# Patient Record
Sex: Female | Born: 1940
Health system: Southern US, Community
[De-identification: ages and names within clinical notes are randomized; demographics above are authoritative.]

## PROBLEM LIST (undated history)

## (undated) DIAGNOSIS — R911 Solitary pulmonary nodule: Secondary | ICD-10-CM

## (undated) DIAGNOSIS — I447 Left bundle-branch block, unspecified: Secondary | ICD-10-CM

## (undated) DIAGNOSIS — I428 Other cardiomyopathies: Secondary | ICD-10-CM

## (undated) HISTORY — DX: Other cardiomyopathies: I42.8

## (undated) HISTORY — DX: Left bundle-branch block, unspecified: I44.7

## (undated) HISTORY — DX: Solitary pulmonary nodule: R91.1

## (undated) HISTORY — PX: BREAST SURGERY: SHX581

## (undated) HISTORY — PX: BLADDER SURGERY: SHX569

## (undated) HISTORY — PX: ABDOMINAL HYSTERECTOMY: SHX81

## (undated) SURGERY — LEFT AND RIGHT HEART CATHETERIZATION WITH CORONARY/GRAFT ANGIOGRAM
Anesthesia: Moderate Sedation | Laterality: Left

---

## 1998-07-29 ENCOUNTER — Other Ambulatory Visit: Admission: RE | Admit: 1998-07-29 | Discharge: 1998-07-29 | Payer: Self-pay | Admitting: Obstetrics & Gynecology

## 1999-09-14 ENCOUNTER — Other Ambulatory Visit: Admission: RE | Admit: 1999-09-14 | Discharge: 1999-09-14 | Payer: Self-pay | Admitting: Obstetrics and Gynecology

## 2000-11-29 ENCOUNTER — Other Ambulatory Visit: Admission: RE | Admit: 2000-11-29 | Discharge: 2000-11-29 | Payer: Self-pay | Admitting: Obstetrics and Gynecology

## 2002-01-09 ENCOUNTER — Other Ambulatory Visit: Admission: RE | Admit: 2002-01-09 | Discharge: 2002-01-09 | Payer: Self-pay | Admitting: Obstetrics and Gynecology

## 2005-07-01 ENCOUNTER — Emergency Department (HOSPITAL_COMMUNITY): Admission: EM | Admit: 2005-07-01 | Discharge: 2005-07-01 | Payer: Self-pay | Admitting: Emergency Medicine

## 2008-01-16 ENCOUNTER — Ambulatory Visit (HOSPITAL_COMMUNITY): Admission: RE | Admit: 2008-01-16 | Discharge: 2008-01-17 | Payer: Self-pay | Admitting: Obstetrics and Gynecology

## 2010-12-13 NOTE — Op Note (Signed)
Dominique Hernandez, Dominique Hernandez            ACCOUNT NO.:  [id number]   MEDICAL RECORD NO.:  [id number]          PATIENT TYPE:  OIB   LOCATION:  9311                          FACILITY:  [facility]   PHYSICIAN:  Huel Cote, M.D. DATE OF BIRTH:  [birth year]   DATE OF PROCEDURE:  DATE OF DISCHARGE:                               OPERATIVE REPORT   PREOPERATIVE DIAGNOSES:  1. Cystocele, symptomatic.  2. Rectocele.   POSTOPERATIVE DIAGNOSES:  1. Cystocele, symptomatic.  2. Rectocele with mild cuff prolapse also noted.   PROCEDURE:  Anterior-posterior repair.   SURGEON:  Huel Cote, M.D.   ASSISTANT:  Sherron Monday, MD   ANESTHESIA:  General.   FINDINGS:  There was a moderate cystocele and rectocele noted and mild  cuff prolapse as well.   ESTIMATED BLOOD LOSS:  100 mL.   URINE OUTPUT:  350 mL clear urine.   IV FLUIDS:  1500 mL LR.   COMPLICATIONS:  None.   OPERATIVE PROCEDURE:  The patient was taken to the operating room where  general anesthesia was obtained without difficulty.  She was then  prepped and draped in normal sterile fashion in the dorsal lithotomy  position.  Speculum was placed within the vagina, which was inspected  and a very small cystocele noted.  However, when the vaginal cuff was  grasped with very minimal traction, this protruded quite impressively  down to the level of the introitus.  Partial part of this was larger  cystocele and part was some mild cuff prolapse.  The vaginal cuff was  then grasped with 2 Allis clamps and the vaginal mucosa anteriorly was  injected with 5.5% Marcaine along the midline.  A small amount of mucosa  was trimmed away at the vaginal cuff and this was underscored along the  midline down to approximately 2 cm below the urethra.  With this mucosa,  then dissected off the underlying pubovesical fascia.  The mucosa flaps  were retracted laterally and the cystocele reduced medially.  The  pubovesical fascia was then reapproximated  with several interrupted  sutures of 0 Vicryl and the cystocele effectively reduced.  The excess  mucosa was then trimmed away and closed with 2-0 Vicryl in a running  locked fashion.  Good hemostasis was then noted and the cystocele  appeared to have been reduced in entirety.  There was still some slight  vaginal cuff prolapse; however, it was not as nearly apparent as prior  to the repair.  Attention was then turned to the floor of the vagina  where at the introitus several hymenal remnants were noted to be  protruding.  The introital ring was then grasped with Allis clamps and  these hymenal tags were removed with the scissors.  The mucosa was then  trimmed with Mayo scissors and the vaginal mucosa injected with the 0.5%  solution of Marcaine.  This was carried up to the level of the vaginal  cuff.  Vaginal mucosa was then similar to the anterior mucosa  underscored with Metzenbaum scissors and dissected off the underlying  puborectal fascia.  This was then dissected off the mucosal flaps and  these were retracted laterally.  There was a small rectocele noted,  which was easily reducible and the rectocele was reduced with the 0  Vicryl utilized to reapproximate the rectovaginal fascia in several  interrupted sutures.  The excess mucosa was trimmed away and the mucosa  then closed with 2-0 Vicryl in a running fashion.  The vagina was  inspected and easily admitted 2 fingers and all appeared fairly well  supported, and cystocele and rectocele were no longer apparent.  At this  juncture, all incisions were inspected and found to be hemostatic.  Therefore, the vagina was packed with esterase-coated gauze and the  Foley catheter left in place.  The sponge lap and needle counts were  then noted to be correct x2 and the patient was awakened and taken to  the recovery room in stable condition.      Huel Cote, M.D.  Electronically Signed     KR/MEDQ  D:  01/16/2008  T:   01/17/2008  Job:  [id number]

## 2010-12-13 NOTE — H&P (Signed)
Dominique Hernandez, BARTHEL            ACCOUNT NO.:  [id number]   MEDICAL RECORD NO.:  [id number]          PATIENT TYPE:  AMB   LOCATION:  SDC                           FACILITY:  [facility]   PHYSICIAN:  Huel Cote, M.D. DATE OF BIRTH:  12-15-1940   DATE OF ADMISSION:  01/16/2008  DATE OF DISCHARGE:                              HISTORY & PHYSICAL   The patient is a 70 year old female, G2 P2, who is coming in for an  increasingly symptomatic cystocele and a mild to moderate rectocele,  wishing to proceed with an anterior-posterior repair.  She currently has  no significant bladder symptoms.  Her cystocele is most evident in the  standing, squatting position and has become increasingly uncomfortable;  therefore, she is desirous of proceeding with correction.   PAST MEDICAL HISTORY:  Significant only for osteoporosis.   PAST SURGICAL HISTORY:  Total abdominal hysterectomy in 1978 and breast  implants.   PAST OBSTETRICAL HISTORY:  Vaginal delivery x2.   PAST GYN HISTORY:  No abnormal Pap smears.   MEDICATIONS:  Fosamax.   ALLERGIES:  SEPTRA.   There is no family history of breast, colon cancer or other GYN  malignancies.   PHYSICAL EXAM:  Her blood pressure is 148/80.  Weight is 114.  BREASTS:  No masses, discharge or adenopathy noted with normal implants  bilaterally.  CARDIAC:  Regular rate and rhythm.  LUNGS:  Clear.  ABDOMEN:  Soft, nontender.  She has normal external genitalia with some atrophic change.  Cuff has  no lesions.  There is a cystocele present, which expands more with  Valsalva and squat to a moderate degree.  There is a mild to moderate  rectocele as well.  The patient does not have any urinary symptoms or  stress urinary incontinence, mostly just the sensation of pressure and  discomfort when her bladder is bulging through the introitus after she  has been on her feet at times.   She was counseled as to the risks and benefits of surgery including  bleeding,  infection and possible of damage to bowel or bladder.  She  understands these risks and desires to proceed with surgery as stated.  We went over the procedure in detail and as far as postop expectations  and other issues surrounding recovery, she is well aware of all of her  options and desires to proceed as stated.      Huel Cote, M.D.  Electronically Signed     KR/MEDQ  D:  01/15/2008  T:  01/15/2008  Job:  [id number]

## 2011-04-27 LAB — CBC
HCT: 35.9 — ABNORMAL LOW
HCT: 41.6
Hemoglobin: 12
Platelets: 269
RDW: 12.2
WBC: 6.7
WBC: 8.7

## 2012-10-06 ENCOUNTER — Emergency Department (HOSPITAL_COMMUNITY): Payer: Medicare Other

## 2012-10-06 ENCOUNTER — Inpatient Hospital Stay (HOSPITAL_COMMUNITY)
Admission: EM | Admit: 2012-10-06 | Discharge: 2012-10-10 | DRG: 287 | Disposition: A | Discharge status: Home Health | Payer: Medicare Other | Attending: Internal Medicine | Admitting: Internal Medicine

## 2012-10-06 ENCOUNTER — Other Ambulatory Visit: Payer: Self-pay

## 2012-10-06 ENCOUNTER — Encounter (HOSPITAL_COMMUNITY): Payer: Self-pay | Admitting: *Deleted

## 2012-10-06 DIAGNOSIS — I428 Other cardiomyopathies: Secondary | ICD-10-CM | POA: Diagnosis present

## 2012-10-06 DIAGNOSIS — Z87891 Personal history of nicotine dependence: Secondary | ICD-10-CM

## 2012-10-06 DIAGNOSIS — Z8249 Family history of ischemic heart disease and other diseases of the circulatory system: Secondary | ICD-10-CM

## 2012-10-06 DIAGNOSIS — I447 Left bundle-branch block, unspecified: Secondary | ICD-10-CM | POA: Diagnosis present

## 2012-10-06 DIAGNOSIS — R911 Solitary pulmonary nodule: Secondary | ICD-10-CM | POA: Diagnosis present

## 2012-10-06 DIAGNOSIS — I509 Heart failure, unspecified: Secondary | ICD-10-CM | POA: Diagnosis present

## 2012-10-06 DIAGNOSIS — D72829 Elevated white blood cell count, unspecified: Secondary | ICD-10-CM | POA: Diagnosis present

## 2012-10-06 DIAGNOSIS — I5021 Acute systolic (congestive) heart failure: Principal | ICD-10-CM | POA: Diagnosis present

## 2012-10-06 LAB — CBC WITH DIFFERENTIAL/PLATELET
Basophils Relative: 0 % (ref 0–1)
Eosinophils Absolute: 0 10*3/uL (ref 0.0–0.7)
Eosinophils Relative: 0 % (ref 0–5)
HCT: 42.2 % (ref 36.0–46.0)
Hemoglobin: 14.3 g/dL (ref 12.0–15.0)
MCH: 30 pg (ref 26.0–34.0)
MCHC: 33.9 g/dL (ref 30.0–36.0)
Monocytes Absolute: 1.2 10*3/uL — ABNORMAL HIGH (ref 0.1–1.0)
Monocytes Relative: 7 % (ref 3–12)

## 2012-10-06 LAB — TROPONIN I
Troponin I: <0.3 ng/mL (ref <0.30)
Troponin I: 0.33 ng/mL (ref <0.30)

## 2012-10-06 LAB — POCT I-STAT, CHEM 8
BUN: 18 mg/dL (ref 6–23)
Calcium, Ion: 1.09 mmol/L — ABNORMAL LOW (ref 1.13–1.30)
Creatinine, Ser: 0.9 mg/dL (ref 0.50–1.10)
TCO2: 24 mmol/L (ref 0–100)

## 2012-10-06 LAB — CREATININE, SERUM
GFR calc Af Amer: 81 mL/min — ABNORMAL LOW (ref >90)
GFR calc non Af Amer: 70 mL/min — ABNORMAL LOW (ref >90)

## 2012-10-06 LAB — POCT I-STAT TROPONIN I

## 2012-10-06 LAB — CBC
HCT: 43 % (ref 36.0–46.0)
Hemoglobin: 15.1 g/dL — ABNORMAL HIGH (ref 12.0–15.0)
MCV: 87.4 fL (ref 78.0–100.0)
RBC: 4.92 MIL/uL (ref 3.87–5.11)
WBC: 10.5 10*3/uL (ref 4.0–10.5)

## 2012-10-06 MED ORDER — FUROSEMIDE 10 MG/ML IJ SOLN
40.0000 mg | Freq: Every day | INTRAMUSCULAR | Status: DC
  Administered 2012-10-06 – 2012-10-07 (×2): 40 mg via INTRAVENOUS
  Filled 2012-10-06 (×3): qty 4

## 2012-10-06 MED ORDER — ONDANSETRON HCL 4 MG/2ML IJ SOLN
4.0000 mg | Freq: Four times a day (QID) | INTRAMUSCULAR | Status: DC | PRN
  Administered 2012-10-07: 4 mg via INTRAVENOUS
  Filled 2012-10-06: qty 2

## 2012-10-06 MED ORDER — FUROSEMIDE 8 MG/ML PO SOLN: 40.0000 mg | Freq: Once | ORAL | Status: DC

## 2012-10-06 MED ORDER — ISOSORBIDE MONONITRATE 15 MG HALF TABLET
15.0000 mg | ORAL_TABLET | Freq: Every day | ORAL | Status: DC
  Administered 2012-10-06 – 2012-10-10 (×4): 15 mg via ORAL
  Filled 2012-10-06 (×5): qty 1

## 2012-10-06 MED ORDER — POTASSIUM CHLORIDE CRYS ER 10 MEQ PO TBCR
10.0000 meq | EXTENDED_RELEASE_TABLET | Freq: Every day | ORAL | Status: DC
  Administered 2012-10-06 – 2012-10-10 (×5): 10 meq via ORAL
  Filled 2012-10-06 (×5): qty 1

## 2012-10-06 MED ORDER — SODIUM CHLORIDE 0.9 % IJ SOLN: 3.0000 mL | INTRAMUSCULAR | Status: DC | PRN

## 2012-10-06 MED ORDER — SODIUM CHLORIDE 0.9 % IJ SOLN
3.0000 mL | Freq: Two times a day (BID) | INTRAMUSCULAR | Status: DC
  Administered 2012-10-08 – 2012-10-09 (×2): 3 mL via INTRAVENOUS

## 2012-10-06 MED ORDER — HEPARIN SODIUM (PORCINE) 5000 UNIT/ML IJ SOLN
5000.0000 [IU] | Freq: Three times a day (TID) | INTRAMUSCULAR | Status: DC
  Filled 2012-10-06 (×2): qty 1

## 2012-10-06 MED ORDER — HEPARIN BOLUS VIA INFUSION
3000.0000 [IU] | Freq: Once | INTRAVENOUS | Status: AC
  Administered 2012-10-06: 3000 [IU] via INTRAVENOUS
  Filled 2012-10-06: qty 3000

## 2012-10-06 MED ORDER — ACETAMINOPHEN 325 MG PO TABS: 650.0000 mg | ORAL_TABLET | ORAL | Status: DC | PRN

## 2012-10-06 MED ORDER — LISINOPRIL 2.5 MG PO TABS
2.5000 mg | ORAL_TABLET | Freq: Every day | ORAL | Status: DC
  Administered 2012-10-06 – 2012-10-09 (×3): 2.5 mg via ORAL
  Filled 2012-10-06 (×4): qty 1

## 2012-10-06 MED ORDER — ONDANSETRON HCL 4 MG/2ML IJ SOLN: 4.0000 mg | Freq: Three times a day (TID) | INTRAMUSCULAR | Status: AC | PRN

## 2012-10-06 MED ORDER — IOHEXOL 350 MG/ML SOLN
80.0000 mL | Freq: Once | INTRAVENOUS | Status: AC | PRN
  Administered 2012-10-06: 80 mL via INTRAVENOUS

## 2012-10-06 MED ORDER — HEPARIN (PORCINE) IN NACL 100-0.45 UNIT/ML-% IJ SOLN
850.0000 [IU]/h | INTRAMUSCULAR | Status: DC
  Administered 2012-10-06: 650 [IU]/h via INTRAVENOUS
  Administered 2012-10-07: 850 [IU]/h via INTRAVENOUS
  Filled 2012-10-06 (×5): qty 250

## 2012-10-06 MED ORDER — SODIUM CHLORIDE 0.9 % IV SOLN: 250.0000 mL | INTRAVENOUS | Status: DC | PRN

## 2012-10-06 MED ORDER — ALBUTEROL SULFATE (5 MG/ML) 0.5% IN NEBU: 2.5000 mg | INHALATION_SOLUTION | RESPIRATORY_TRACT | Status: AC | PRN

## 2012-10-06 MED ORDER — FUROSEMIDE 10 MG/ML IJ SOLN
40.0000 mg | Freq: Once | INTRAMUSCULAR | Status: AC
  Administered 2012-10-06: 40 mg via INTRAVENOUS
  Filled 2012-10-06: qty 4

## 2012-10-06 MED ORDER — FUROSEMIDE 10 MG/ML IJ SOLN
20.0000 mg | Freq: Every day | INTRAMUSCULAR | Status: DC
  Filled 2012-10-06: qty 2

## 2012-10-06 MED ORDER — CARVEDILOL 3.125 MG PO TABS
3.1250 mg | ORAL_TABLET | Freq: Two times a day (BID) | ORAL | Status: DC
  Administered 2012-10-06 – 2012-10-09 (×5): 3.125 mg via ORAL
  Filled 2012-10-06 (×8): qty 1

## 2012-10-06 MED ORDER — ASPIRIN EC 81 MG PO TBEC
81.0000 mg | DELAYED_RELEASE_TABLET | Freq: Every day | ORAL | Status: DC
  Administered 2012-10-06 – 2012-10-10 (×4): 81 mg via ORAL
  Filled 2012-10-06 (×5): qty 1

## 2012-10-06 NOTE — ED Notes (Signed)
Pt states that she was sleeping lying on the couch and she got up to go to bed,and became SOB. Pt states that since arrival to E.D. Dominique Hernandez is better. Pt denies cough, denies pain.

## 2012-10-06 NOTE — H&P (Addendum)
[facility] History and Physical  Dominique Hernandez WUJ:[id number] DOB: November 19, 1940 DOA: 10/06/2012  Referring physician: Dr. Rulon Abide PCP: Ailene Ravel, MD  Specialists: [facility] heart and vascular. Dr. Allyson Sabal  Chief Complaint: Shortness of breath  HPI: Dominique Hernandez is a 72 y.o. female  Has been generally healthy. Present to the emergency department complaining of shortness of breath. The shortness of breath has been present for 2 weeks. Activity reportedly makes the shortness of breath worse. This is also been accompanied by cough which has been nonproductive. Nothing that the patient is aware of makes it better. The patient also endorses 2 pillow orthopnea. She denies any fever or sick contacts. The problem since onset has been persistent and worsening as a result patient decided to come to the emergency department for further evaluation.  While in the ED the patient was found to have a BNP elevated at 4900. CT angiogram was performed which was interpreted as no evidence of pulmonary embolism with mild congestive heart failure. There was also a 6 mm lung nodule and subsequent 6 month CT evaluation was recommended by the radiologist. We were consult for admission evaluation given the onset CHF.   Review of Systems: 10 point review of system reviewed with patient and negative unless otherwise mentioned above History reviewed. No pertinent past medical history. Past Surgical History  Procedure Laterality Date  . Abdominal hysterectomy     Social History:  reports that she has never smoked. She does not have any smokeless tobacco history on file. She reports that she does not drink alcohol or use illicit drugs. where does patient live--home, ALF, SNF? and with whom if at home? Lives at home Can patient participate in ADLs? Yes  No Known Allergies  History reviewed. No pertinent family history.  Family history of heart disease specifically congestive heart failure  Prior to  Admission medications   Medication Sig Start Date End Date Taking? Authorizing Aicia Babinski  Calcium Carbonate-Vitamin D (CALCIUM + D PO) Take 1 tablet by mouth daily.   Yes Historical Mae Denunzio, MD  Omega-3 Fatty Acids (FISH OIL PO) Take 1 capsule by mouth daily.   Yes Historical Rebbecca Osuna, MD   Physical Exam: Filed Vitals:   10/06/12 0600 10/06/12 0615 10/06/12 0630 10/06/12 0714  BP: 135/80 125/63 123/72 117/71  Pulse: 121 107 100 99  Temp:      TempSrc:      Resp: 21 18 22 20   SpO2: 93% 93% 92%      General:  Patient in no acute distress, alert and awake  Eyes: Extraocular movements intact, nonicteric  ENT: Normal exterior appearance, no masses on visual exam  Neck: Supple, no goiter  Cardiovascular: Regular rate and rhythm, no murmurs   Respiratory: rhales at bases bilaterally, no wheezes, breath sounds bilaterally  Abdomen: soft nontender nondistended  Skin: Warm and dry  Musculoskeletal: No cyanosis no clubbing  Psychiatric: Mood and affect appropriate  Neurologic: Answers questions appropriately, moves all extremities equally  Labs on Admission:  Basic Metabolic Panel:  Recent Labs Lab 10/06/12 0558  NA 140  K 3.5  CL 105  GLUCOSE 222*  BUN 18  CREATININE 0.90   Liver Function Tests: No results found for this basename: AST, ALT, ALKPHOS, BILITOT, PROT, ALBUMIN,  in the last 168 hours No results found for this basename: LIPASE, AMYLASE,  in the last 168 hours No results found for this basename: AMMONIA,  in the last 168 hours CBC:  Recent Labs Lab 10/06/12 0545 10/06/12 0558  WBC 16.0*  --   NEUTROABS 13.1*  --   HGB 14.3 14.6  HCT 42.2 43.0  MCV 88.7  --   PLT 258  --    Cardiac Enzymes: No results found for this basename: CKTOTAL, CKMB, CKMBINDEX, TROPONINI,  in the last 168 hours  BNP (last 3 results)  Recent Labs  10/06/12 0545  PROBNP 4952.0*   CBG: No results found for this basename: GLUCAP,  in the last 168 hours  Radiological  Exams on Admission: Ct Angio Chest Pe W/cm &/or Wo Cm  10/06/2012  *RADIOLOGY REPORT*  Clinical Data: Shortness of breath and tachycardia.  Hypoxia. History of hysterectomy.  CT ANGIOGRAPHY CHEST  Technique:  Multidetector CT imaging of the chest using the standard protocol during bolus administration of intravenous contrast. Multiplanar reconstructed images including MIPs were obtained and reviewed to evaluate the vascular anatomy.  Contrast: 80mL OMNIPAQUE IOHEXOL 350 MG/ML SOLN  Comparison: Plain films earlier today  Findings: Lung windows demonstrate moderate centrilobular emphysema.  Patchy bibasilar atelectasis. Septal thickening at the lung bases, most consistent with pulmonary venous congestion.  Minimal right lower lobe subpleural nodularity, measuring up to 3 mm on image 49/series 5 A 6 mm right lower lobe nodule on image 67/series 5  Soft tissue windows:  The quality of this exam for evaluation of pulmonary embolism is good. No evidence of pulmonary embolism.  Bilateral breast implants.  Normal aortic caliber.  Mild cardiomegaly with small bilateral pleural effusions.  No pericardial effusion.  Subcarinal node measures 1.5 cm on image 42/series 4.  Prominent right infrahilar nodal tissue on image 49/series 4.  Limited abdominal imaging demonstrates old granulomatous disease in the liver.  Tiny left liver lobe cyst.  No acute osseous abnormality.  IMPRESSION:  1. No evidence of pulmonary embolism. 2.  Findings of mild congestive heart failure. 3.  Lung nodules up to 6 mm. Given the concurrent centrilobular emphysema, follow-up chest CT at 6  months is recommended.  This recommendation follows the consensus statement: "Guidelines for Management of Small Pulmonary Nodules Detected on CT Scans: A Statement from the Fleischner Society" as published in Radiology 2005; 237:395-400. Online at: DietDisorder.cz.  4.  Prominent thoracic nodes.  Favored to be related to  congestive heart failure.  These can be reevaluated at follow-up.   Original Report Authenticated By: Jeronimo Greaves, M.D.    Dg Chest Port 1 View  10/06/2012  *RADIOLOGY REPORT*  Clinical Data: Shortness of breath.  PORTABLE CHEST - 1 VIEW  Comparison: Chest radiograph performed 07/01/2005  Findings: The lungs are well-aerated.  Small bilateral pleural effusions are likely present.  Bibasilar airspace opacification is noted, with underlying vascular congestion.  Findings raise concern for pulmonary edema, though multifocal pneumonia could have a similar appearance.  No pneumothorax is seen.  The cardiomediastinal silhouette is borderline enlarged.  No acute osseous abnormalities are seen.  IMPRESSION: Bibasilar airspace opacification, with underlying vascular congestion and borderline cardiomegaly.  Likely small bilateral pleural effusions.  Findings raise concern for pulmonary edema, though multifocal pneumonia could have a similar appearance.   Original Report Authenticated By: Tonia Ghent, M.D.     EKG: Independently reviewed. Sinus tachycardia with left bundle branch block no prior EKG for comparison  Assessment/Plan Principal Problem:   Acute exacerbation of CHF (congestive heart failure) Active Problems:   Lung nodule   Leukocytosis   1. Acute exacerbation of his congestive heart failure - Daily weights, monitor I.'s and O's - EKG next a.m. - Cardiology consulted  Given new-onset CHF - Troponins every 6 hours - Diuretics with Lasix 20mg  IV daily - Will add ACE inhibitor - Place on aspirin  - Addendum: Etiology uncertain and there for will evaluate with echocardiogram  2. Leukocytosis - Most likely a stress reaction to #1 - At this point no source of infection and therefore will not start antibiotics  3. lung nodule - As recommended by radiologist will recommend repeat CT scan of chest in 6 months  4. DVT prophylaxis - heparin   Code Status: full  Family Communication: spoke  to sister and daughter in room Disposition Plan:  Pending further work up and recommendations.  Time spent: > 60 minutes  Penny Pia [facility] [phone]  If 7PM-7AM, please contact night-coverage [email or web address] Password [email or web address] 10/06/2012, 11:01 AM

## 2012-10-06 NOTE — ED Provider Notes (Signed)
History     CSN: [id number]  Arrival date & time 10/06/12  0504   None     Chief Complaint  Patient presents with  . Shortness of Breath    (Consider location/radiation/quality/duration/timing/severity/associated sxs/prior treatment) HPI Dominique Hernandez is a 72 y.o. female chief complaint of shortness of breath. Patient has no known medical history at this time. She is only taking supplements. Patient says she was lying on the couch this evening, woke up to go to bed and she was short of breath, on arrival, patient SpO2 was 85% with a pulse rate of 129.  Her shortness of breath had been constant since earlier this evening, exertion made it worse, there is no associated chest pain, no headaches, no fevers, no chills, no abdominal pain, no nausea or vomiting. Patient has no history of venous thromboembolic disease, has not had any hemoptysis, denies any history of heart failure, swelling in the ankles, or occupational exposure which may affect her lungs such as asbestos, the textile industry.  Patient admits to being a prior smoker, she has an approximate 10-pack-year history smoking half a pack of cigarettes over a 20 year period.  Denies illicit drug.   History reviewed. No pertinent past medical history.  Past Surgical History  Procedure Laterality Date  . Abdominal hysterectomy      History reviewed. No pertinent family history.  History  Substance Use Topics  . Smoking status: Never Smoker   . Smokeless tobacco: Not on file  . Alcohol Use: No    OB History   Grav Para Term Preterm Abortions TAB SAB Ect Mult Living                  Review of Systems At least 10pt or greater review of systems completed and are negative except where specified in the HPI.  Allergies  Review of patient's allergies indicates no known allergies.  Home Medications  No current outpatient prescriptions on file.  BP 160/90  Pulse 129  Temp(Src) 97.9 F (36.6 C) (Oral)  Resp 20  SpO2  85%  Physical Exam  Nursing notes reviewed.  Electronic medical record reviewed. VITAL SIGNS:   Filed Vitals:   10/06/12 0600 10/06/12 0615 10/06/12 0630 10/06/12 0714  BP: 135/80 125/63 123/72 117/71  Pulse: 121 107 100 99  Temp:      TempSrc:      Resp: 21 18 22 20   SpO2: 93% 93% 92%    CONSTITUTIONAL: Awake, oriented x4, appears to be having mild difficulty breathing, dyspneic on my exam breathing about 28 times a minute HENT: Atraumatic, normocephalic, oral mucosa pink and moist, airway patent. Nares patent without drainage. External ears normal. EYES: Conjunctiva clear, EOMI, PERRLA NECK: Trachea midline, non-tender, supple CARDIOVASCULAR: Normal heart rate, Normal rhythm, No murmurs, rubs, gallops PULMONARY/CHEST: Decreased breath sounds bilaterally, rales at the bases to mid field, no wheezing, no rhonchi. Chest is nontender, bilateral breast implants. ABDOMINAL: Non-distended, soft, non-tender - no rebound or guarding.  BS normal. NEUROLOGIC: Non-focal, moving all four extremities, no gross sensory or motor deficits. EXTREMITIES: No clubbing, cyanosis, or trace  edema SKIN: Warm, Dry, No erythema, No rash   ED Course  Procedures (including critical care time)  Date: 10/06/2012  Rate: 119  Rhythm: Sinus tachycardia  QRS Axis: Left axis deviation  Intervals: Prolonged QRS  ST/T Wave abnormalities: normal  Conduction Disutrbances: Left bundle branch block  Narrative Interpretation: Likely left atrial enlargement. Left bundle branch block, sinus tachycardia, no prior EKG  for comparison. Does not meet Sgarbossa criteria: No ST elevation >= 1mm in a lead with concordant QRS complex No ST depression >= 1mm in ilead V1, V2 or V3 No ST elevation >=35mm in a lead with discordant QRS complex    Labs Reviewed  CBC WITH DIFFERENTIAL - Abnormal; Notable for the following:    WBC 16.0 (*)    Neutrophils Relative 82 (*)    Neutro Abs 13.1 (*)    Lymphocytes Relative 10 (*)     Monocytes Absolute 1.2 (*)    All other components within normal limits  D-DIMER, QUANTITATIVE - Abnormal; Notable for the following:    D-Dimer, Quant 1.27 (*)    All other components within normal limits  PRO B NATRIURETIC PEPTIDE - Abnormal; Notable for the following:    Pro B Natriuretic peptide (BNP) 4952.0 (*)    All other components within normal limits  POCT I-STAT, CHEM 8 - Abnormal; Notable for the following:    Glucose, Bld 222 (*)    Calcium, Ion 1.09 (*)    All other components within normal limits  POCT I-STAT TROPONIN I   Ct Angio Chest Pe W/cm &/or Wo Cm  10/06/2012  *RADIOLOGY REPORT*  Clinical Data: Shortness of breath and tachycardia.  Hypoxia. History of hysterectomy.  CT ANGIOGRAPHY CHEST  Technique:  Multidetector CT imaging of the chest using the standard protocol during bolus administration of intravenous contrast. Multiplanar reconstructed images including MIPs were obtained and reviewed to evaluate the vascular anatomy.  Contrast: 80mL OMNIPAQUE IOHEXOL 350 MG/ML SOLN  Comparison: Plain films earlier today  Findings: Lung windows demonstrate moderate centrilobular emphysema.  Patchy bibasilar atelectasis. Septal thickening at the lung bases, most consistent with pulmonary venous congestion.  Minimal right lower lobe subpleural nodularity, measuring up to 3 mm on image 49/series 5 A 6 mm right lower lobe nodule on image 67/series 5  Soft tissue windows:  The quality of this exam for evaluation of pulmonary embolism is good. No evidence of pulmonary embolism.  Bilateral breast implants.  Normal aortic caliber.  Mild cardiomegaly with small bilateral pleural effusions.  No pericardial effusion.  Subcarinal node measures 1.5 cm on image 42/series 4.  Prominent right infrahilar nodal tissue on image 49/series 4.  Limited abdominal imaging demonstrates old granulomatous disease in the liver.  Tiny left liver lobe cyst.  No acute osseous abnormality.  IMPRESSION:  1. No evidence of  pulmonary embolism. 2.  Findings of mild congestive heart failure. 3.  Lung nodules up to 6 mm. Given the concurrent centrilobular emphysema, follow-up chest CT at 6  months is recommended.  This recommendation follows the consensus statement: "Guidelines for Management of Small Pulmonary Nodules Detected on CT Scans: A Statement from the Fleischner Society" as published in Radiology 2005; 237:395-400. Online at: DietDisorder.cz.  4.  Prominent thoracic nodes.  Favored to be related to congestive heart failure.  These can be reevaluated at follow-up.   Original Report Authenticated By: Jeronimo Greaves, M.D.    Dg Chest Port 1 View  10/06/2012  *RADIOLOGY REPORT*  Clinical Data: Shortness of breath.  PORTABLE CHEST - 1 VIEW  Comparison: Chest radiograph performed 07/01/2005  Findings: The lungs are well-aerated.  Small bilateral pleural effusions are likely present.  Bibasilar airspace opacification is noted, with underlying vascular congestion.  Findings raise concern for pulmonary edema, though multifocal pneumonia could have a similar appearance.  No pneumothorax is seen.  The cardiomediastinal silhouette is borderline enlarged.  No acute osseous abnormalities are  seen.  IMPRESSION: Bibasilar airspace opacification, with underlying vascular congestion and borderline cardiomegaly.  Likely small bilateral pleural effusions.  Findings raise concern for pulmonary edema, though multifocal pneumonia could have a similar appearance.   Original Report Authenticated By: Tonia Ghent, M.D.      1. CHF, acute   2. Acute exacerbation of CHF (congestive heart failure)   3. Leukocytosis   4. Lung nodule       MDM  JHANAE JASKOWIAK is a 72 y.o. female with no pertinent medical history presents with acute onset dyspnea that started last night-physical exam is consistent with volume overload with rales in the bases and diminished breath sounds.  Acute onset concerning for PE  also.  LBBB on EKG - no prior EKG for comparison - no chest pain.  Doesn't meet Sgarbossa criteria.  I don't think this is ACS, likely had LBBB previously from prior microvascular/cardiomyopathy disease and CHF as sequelae.  Labs including BNP and Trop ordered.   BNP elevated, trop negative.  DDim also elevated - CT angiogram ordered - shows no acute PE.  Bilateral pulmonary effusions, likely COPD.  CHF most likely, responded well to lasix and breathing better.  Discussed admission for new onset acute HF with patient and with [facility].  Admit stable to Tele.          Jones Skene, MD 10/06/12 1419

## 2012-10-06 NOTE — Consult Note (Signed)
Reason for Consult: acute CHF  Referring Physician: Dr. Cena Benton [facility]  Cardiology: NEW- Dr. Allyson Sabal    PCP Dr. Sharman Crate Hamrick    Dominique Hernandez is an 72 y.o. female.    Chief Complaint: acute SOB   HPI: 72 y.o. female in good health presented to the emergency department early this am complaining of shortness of breath. The shortness of breath has been present for 2 weeks, but today it was severe.  Activity reportedly makes the shortness of breath worse. This is also been accompanied by cough which has been nonproductive. Nothing that the patient is aware of makes it better. The patient also endorses 2 pillow orthopnea. This AM she had to sit straight up and even bend forward slightly to breathe. She denied any fever or sick contacts, no recent viral illnesses. The problem since onset has been persistent and worsening as a result patient decided to come to the emergency department for further evaluation.   In the ED the patient was found to have a BNP elevated at 4900. CT angiogram was performed which was interpreted as no evidence of pulmonary embolism with mild congestive heart failure. There was also a 6 mm lung nodule and subsequent 6 month CT evaluation was recommended by the radiologist. EKG with LBBB and pt is tachycardic.  She denies any chest pain.  Initial troponin poc 0.02.    Currently after 40 of lasix she is breathing better.      History reviewed. No pertinent past medical history. Denies HTN, CAD, CVA.  Mildly elevated cholesterol.  Past Surgical History  Procedure Laterality Date  . Abdominal hysterectomy      Family History  Problem Relation Age of Onset  . CAD Mother   . Other Father 74    rocky [place] spotted fever  . CAD Sister   . CAD Brother   . CAD Sister   . CAD Sister   . CAD Sister    Social History:  reports that she quit smoking about 9 years ago. She has never used smokeless tobacco. She reports that she does not drink alcohol or use illicit drugs.    Allergies: No Known Allergies  Outpatient Medication: calcium, fish oil  Results for orders placed during the hospital encounter of 10/06/12 (from the past 48 hour(s))  CBC WITH DIFFERENTIAL     Status: Abnormal   Collection Time    10/06/12  5:45 AM      Result Value Range   WBC 16.0 (*) 4.0 - 10.5 K/uL   RBC 4.76  3.87 - 5.11 MIL/uL   Hemoglobin 14.3  12.0 - 15.0 g/dL   HCT 47.8  29.5 - 62.1 %   MCV 88.7  78.0 - 100.0 fL   MCH 30.0  26.0 - 34.0 pg   MCHC 33.9  30.0 - 36.0 g/dL   RDW 30.8  65.7 - 84.6 %   Platelets 258  150 - 400 K/uL   Neutrophils Relative 82 (*) 43 - 77 %   Neutro Abs 13.1 (*) 1.7 - 7.7 K/uL   Lymphocytes Relative 10 (*) 12 - 46 %   Lymphs Abs 1.6  0.7 - 4.0 K/uL   Monocytes Relative 7  3 - 12 %   Monocytes Absolute 1.2 (*) 0.1 - 1.0 K/uL   Eosinophils Relative 0  0 - 5 %   Eosinophils Absolute 0.0  0.0 - 0.7 K/uL   Basophils Relative 0  0 - 1 %   Basophils Absolute 0.0  0.0 - 0.1 K/uL  D-DIMER, QUANTITATIVE     Status: Abnormal   Collection Time    10/06/12  5:45 AM      Result Value Range   D-Dimer, Quant 1.27 (*) 0.00 - 0.48 ug/mL-FEU   Comment:            AT THE INHOUSE ESTABLISHED CUTOFF     VALUE OF 0.48 ug/mL FEU,     THIS ASSAY HAS BEEN DOCUMENTED     IN THE LITERATURE TO HAVE     A SENSITIVITY AND NEGATIVE     PREDICTIVE VALUE OF AT LEAST     98 TO 99%.  THE TEST RESULT     SHOULD BE CORRELATED WITH     AN ASSESSMENT OF THE CLINICAL     PROBABILITY OF DVT / VTE.  PRO B NATRIURETIC PEPTIDE     Status: Abnormal   Collection Time    10/06/12  5:45 AM      Result Value Range   Pro B Natriuretic peptide (BNP) 4952.0 (*) 0 - 125 pg/mL  POCT I-STAT TROPONIN I     Status: None   Collection Time    10/06/12  5:56 AM      Result Value Range   Troponin i, poc 0.02  0.00 - 0.08 ng/mL   Comment 3            Comment: Due to the release kinetics of cTnI,     a negative result within the first hours     of the onset of symptoms does not rule  out     myocardial infarction with certainty.     If myocardial infarction is still suspected,     repeat the test at appropriate intervals.  POCT I-STAT, CHEM 8     Status: Abnormal   Collection Time    10/06/12  5:58 AM      Result Value Range   Sodium 140  135 - 145 mEq/L   Potassium 3.5  3.5 - 5.1 mEq/L   Chloride 105  96 - 112 mEq/L   BUN 18  6 - 23 mg/dL   Creatinine, Ser 1.61  0.50 - 1.10 mg/dL   Glucose, Bld 096 (*) 70 - 99 mg/dL   Calcium, Ion 0.45 (*) 1.13 - 1.30 mmol/L   TCO2 24  0 - 100 mmol/L   Hemoglobin 14.6  12.0 - 15.0 g/dL   HCT 40.9  81.1 - 91.4 %   Ct Angio Chest Pe W/cm &/or Wo Cm  10/06/2012  *RADIOLOGY REPORT*  Clinical Data: Shortness of breath and tachycardia.  Hypoxia. History of hysterectomy.  CT ANGIOGRAPHY CHEST  Technique:  Multidetector CT imaging of the chest using the standard protocol during bolus administration of intravenous contrast. Multiplanar reconstructed images including MIPs were obtained and reviewed to evaluate the vascular anatomy.  Contrast: 80mL OMNIPAQUE IOHEXOL 350 MG/ML SOLN  Comparison: Plain films earlier today  Findings: Lung windows demonstrate moderate centrilobular emphysema.  Patchy bibasilar atelectasis. Septal thickening at the lung bases, most consistent with pulmonary venous congestion.  Minimal right lower lobe subpleural nodularity, measuring up to 3 mm on image 49/series 5 A 6 mm right lower lobe nodule on image 67/series 5  Soft tissue windows:  The quality of this exam for evaluation of pulmonary embolism is good. No evidence of pulmonary embolism.  Bilateral breast implants.  Normal aortic caliber.  Mild cardiomegaly with small bilateral pleural effusions.  No pericardial effusion.  Subcarinal node  measures 1.5 cm on image 42/series 4.  Prominent right infrahilar nodal tissue on image 49/series 4.  Limited abdominal imaging demonstrates old granulomatous disease in the liver.  Tiny left liver lobe cyst.  No acute osseous  abnormality.  IMPRESSION:  1. No evidence of pulmonary embolism. 2.  Findings of mild congestive heart failure. 3.  Lung nodules up to 6 mm. Given the concurrent centrilobular emphysema, follow-up chest CT at 6  months is recommended.  This recommendation follows the consensus statement: "Guidelines for Management of Small Pulmonary Nodules Detected on CT Scans: A Statement from the Fleischner Society" as published in Radiology 2005; 237:395-400. Online at: DietDisorder.cz.  4.  Prominent thoracic nodes.  Favored to be related to congestive heart failure.  These can be reevaluated at follow-up.   Original Report Authenticated By: Jeronimo Greaves, M.D.    Dg Chest Port 1 View  10/06/2012  *RADIOLOGY REPORT*  Clinical Data: Shortness of breath.  PORTABLE CHEST - 1 VIEW  Comparison: Chest radiograph performed 07/01/2005  Findings: The lungs are well-aerated.  Small bilateral pleural effusions are likely present.  Bibasilar airspace opacification is noted, with underlying vascular congestion.  Findings raise concern for pulmonary edema, though multifocal pneumonia could have a similar appearance.  No pneumothorax is seen.  The cardiomediastinal silhouette is borderline enlarged.  No acute osseous abnormalities are seen.  IMPRESSION: Bibasilar airspace opacification, with underlying vascular congestion and borderline cardiomegaly.  Likely small bilateral pleural effusions.  Findings raise concern for pulmonary edema, though multifocal pneumonia could have a similar appearance.   Original Report Authenticated By: Tonia Ghent, M.D.     ROS: General:no colds or fevers, no weight changes Skin:no rashes or ulcers HEENT:no blurred vision, no congestion CV:see HPI PUL:see HPI GI:no diarrhea constipation or melena, no indigestion GU:no hematuria, no dysuria MS:no joint pain, no claudication Neuro:no syncope, no lightheadedness, no hx CVA Endo:no diabetes, no thyroid disease    Blood pressure 126/92, pulse 113, temperature 97.9 F (36.6 C), temperature source Oral, resp. rate 20, SpO2 95.00%. PE: General:alert NAD currently, pleasant affect Skin:warm and dry brisk capillary refill HEENT:normocephalic sclera clear Neck:supple, no JVD, no bruits no thyromegaly  Heart:S1S2 rapid + gallup Lungs:+ rales and diminished breath sounds - wheezes Abd:+ BS soft non tender Ext:no edema, 2+ pedal pulses Bil. Neuro:alert and oriented X 3 MAE, follows commands    Assessment/Plan Principal Problem:   Acute exacerbation of CHF (congestive heart failure) rule out Mi Active Problems:   Lung nodule   Leukocytosis   LBBB (left bundle branch block), no old to compare   PLAN: acute CHF improved with lasix, elevated ddimer but neg. CT angio for PE.  LBBB ? New.  Continue diuretic, add imdur.  Echo,  For possible nuc on Tuesday unless enzymes positive then cath.  Will add low dose coreg. ZOXWRU,EAVWU R 10/06/2012, 1:51 PM     Agree with note written by Nada Boozer RNP  New onset CHF and LBBB (no old EKG). + CRF including family history. DOE for months, worse SOB last night. BNP 5000, CXR c/w CHF. Has rales on exam. Rx with iv lasix. Plan admit to tele (4700), low dose BB, nitrate and ACE=I. IV hep. 2D echo. CTA neg for PE. Suspect that this is ischemically mediated and she  will ultimately need cath once diuresed.   Runell Gess 10/06/2012 2:11 PM

## 2012-10-06 NOTE — Progress Notes (Signed)
ANTICOAGULATION CONSULT NOTE - Initial Consult  Pharmacy Consult for Heparin Indication: chest pain/ACS  No Known Allergies  Patient Measurements: Height: 5\' 2"  (157.5 cm) Weight: 120 lb 12.8 oz (54.795 kg) IBW/kg (Calculated) : 50.1 Heparin Dosing Weight: 55 kg  Vital Signs: Temp: 97.5 F (36.4 C) (03/09 1447) Temp src: Oral (03/09 1447) BP: 128/98 mmHg (03/09 1447) Pulse Rate: 119 (03/09 1447)  Labs:  Recent Labs  10/06/12 0545 10/06/12 0558 10/06/12 1305  HGB 14.3 14.6  --   HCT 42.2 43.0  --   PLT 258  --   --   CREATININE  --  0.90 0.82  TROPONINI  --   --  <0.30    Estimated Creatinine Clearance: 49.8 ml/min (by C-G formula based on Cr of 0.82).   Medical History: History reviewed. No pertinent past medical history.   Assessment: 71YO generally healthy female Present to the emergency department complaining of shortness of breath, pro BNP elevated, CTA negative. Confirmed with PA, the new onset CHF is suspected to be ischemically mediated, will start IV heparin, likely for cath next week. Pt. Doesn't take anticoagulants prior to admission.   Goal of Therapy:  Heparin level 0.3-0.7 units/ml Monitor platelets by anticoagulation protocol: Yes   Plan:  - Heparin bolus 3000 units x 1 - heparin infusion 650 units/hr - 8 hr heparin level at 2330 - daily heparin level and CBC  Bayard Hugger, PharmD, BCPS  Clinical Pharmacist  Pager: [phone]   10/06/2012,2:52 PM

## 2012-10-06 NOTE — ED Notes (Signed)
Cardiologist at bedside.  

## 2012-10-06 NOTE — ED Notes (Signed)
Patient transported to CT 

## 2012-10-06 NOTE — Progress Notes (Signed)
Positive troponin of 0.33 called to unit. Pt denies pain. VSS. Corine Shelter, [place] notified. Orders received. Will cont to monitor.

## 2012-10-07 LAB — BASIC METABOLIC PANEL
BUN: 26 mg/dL — ABNORMAL HIGH (ref 6–23)
CO2: 23 mEq/L (ref 19–32)
CO2: 24 mEq/L (ref 19–32)
Calcium: 9.2 mg/dL (ref 8.4–10.5)
Chloride: 100 mEq/L (ref 96–112)
Chloride: 101 mEq/L (ref 96–112)
Chloride: 102 mEq/L (ref 96–112)
Creatinine, Ser: 1.31 mg/dL — ABNORMAL HIGH (ref 0.50–1.10)
GFR calc Af Amer: 40 mL/min — ABNORMAL LOW (ref >90)
GFR calc Af Amer: 46 mL/min — ABNORMAL LOW (ref >90)
Glucose, Bld: 119 mg/dL — ABNORMAL HIGH (ref 70–99)
Potassium: 3.7 mEq/L (ref 3.5–5.1)
Sodium: 136 mEq/L (ref 135–145)
Sodium: 139 mEq/L (ref 135–145)

## 2012-10-07 LAB — CBC
HCT: 40.7 % (ref 36.0–46.0)
MCH: 30.3 pg (ref 26.0–34.0)
MCV: 89.3 fL (ref 78.0–100.0)
Platelets: 271 10*3/uL (ref 150–400)
RBC: 4.56 MIL/uL (ref 3.87–5.11)
WBC: 8.7 10*3/uL (ref 4.0–10.5)

## 2012-10-07 LAB — TSH: TSH: 1.141 u[IU]/mL (ref 0.350–4.500)

## 2012-10-07 LAB — TROPONIN I: Troponin I: <0.3 ng/mL (ref <0.30)

## 2012-10-07 LAB — PRO B NATRIURETIC PEPTIDE
Pro B Natriuretic peptide (BNP): 8584 pg/mL — ABNORMAL HIGH (ref 0–125)
Pro B Natriuretic peptide (BNP): 7333 pg/mL — ABNORMAL HIGH (ref 0–125)

## 2012-10-07 LAB — HEPARIN LEVEL (UNFRACTIONATED)
Heparin Unfractionated: 0.25 IU/mL — ABNORMAL LOW (ref 0.30–0.70)
Heparin Unfractionated: 0.33 IU/mL (ref 0.30–0.70)

## 2012-10-07 MED ORDER — HEPARIN BOLUS VIA INFUSION
1500.0000 [IU] | Freq: Once | INTRAVENOUS | Status: AC
  Administered 2012-10-07: 1500 [IU] via INTRAVENOUS
  Filled 2012-10-07: qty 1500

## 2012-10-07 MED ORDER — SODIUM CHLORIDE 0.9 % IJ SOLN: 3.0000 mL | INTRAMUSCULAR | Status: DC | PRN

## 2012-10-07 MED ORDER — DIAZEPAM 5 MG PO TABS
5.0000 mg | ORAL_TABLET | ORAL | Status: AC
  Administered 2012-10-08: 5 mg via ORAL
  Filled 2012-10-07: qty 1

## 2012-10-07 MED ORDER — ASPIRIN 81 MG PO CHEW
324.0000 mg | CHEWABLE_TABLET | ORAL | Status: AC
  Administered 2012-10-08: 324 mg via ORAL
  Filled 2012-10-07: qty 4

## 2012-10-07 MED ORDER — SODIUM CHLORIDE 0.9 % IV SOLN
1.0000 mL/kg/h | INTRAVENOUS | Status: DC
  Administered 2012-10-08: 1 mL/kg/h via INTRAVENOUS

## 2012-10-07 MED ORDER — POTASSIUM CHLORIDE CRYS ER 20 MEQ PO TBCR
40.0000 meq | EXTENDED_RELEASE_TABLET | Freq: Once | ORAL | Status: AC
  Administered 2012-10-07: 40 meq via ORAL

## 2012-10-07 MED ORDER — SODIUM CHLORIDE 0.9 % IV SOLN: 250.0000 mL | INTRAVENOUS | Status: DC | PRN

## 2012-10-07 MED ORDER — POTASSIUM CHLORIDE CRYS ER 20 MEQ PO TBCR
EXTENDED_RELEASE_TABLET | ORAL | Status: AC
  Filled 2012-10-07: qty 2

## 2012-10-07 MED ORDER — SODIUM CHLORIDE 0.9 % IJ SOLN: 3.0000 mL | Freq: Two times a day (BID) | INTRAMUSCULAR | Status: DC

## 2012-10-07 NOTE — Progress Notes (Signed)
Pt. Seen and examined. Agree with the NP/PA-C note as written.  72 yo female with new CHF symptoms - LBBB, without old EKG to compare, 2-3 weeks of progressive dyspnea. Several episodes of "severe reflux", but no chest pain. May have been a subacute MI.  Echo today shows severe LV dysfunction, EF 15% with LAD territory WMA that appears infarcted and generalized global hypokinesis. Discussed at length today with her and her husband / son, recommending a LHC / RHC tomorrow. Will use minimal dye due to mild renal dysfunction.   Will be happy to assume care of the patient on our service for primarily cardiac issues.  Chrystie Nose, MD, [facility] Attending Cardiologist [facility]

## 2012-10-07 NOTE — Progress Notes (Signed)
Pt c/o nausea. Denies CP. VSS. Am 12 lead EKG done with some changes noted in V leads. Corine Shelter PA notified. No new orders. Cont to monitor.

## 2012-10-07 NOTE — Progress Notes (Signed)
  Echocardiogram 2D Echocardiogram has been performed.  Dominique Hernandez A 10/07/2012, 12:34 PM

## 2012-10-07 NOTE — Progress Notes (Signed)
Subjective: No complaints except nausea this AM    Objective: Vital signs in last 24 hours: Temp:  [97.5 F (36.4 C)-98.6 F (37 C)] 98.6 F (37 C) (03/10 0440) Pulse Rate:  [106-120] 106 (03/10 0440) Resp:  [18-94] 90 (03/10 0940) BP: (90-144)/(57-98) 90/57 mmHg (03/10 0940) SpO2:  [93 %-96 %] 93 % (03/10 0758) Weight:  [54.795 kg (120 lb 12.8 oz)-56.382 kg (124 lb 4.8 oz)] 56.382 kg (124 lb 4.8 oz) (03/10 0700) Weight change:  Last BM Date: 10/06/12 Intake/Output from previous day: +65 wt up 4 pounds 03/09 0701 - 03/10 0700 In: 865.8 [P.O.:650; I.V.:95.8] Out: 800 [Urine:800] Intake/Output this shift: Total I/O In: 480 [P.O.:480] Out: -   PE: General:alert and oriented, pleasant affect NAD Heart:S1S2 RRR  Tele SR ST with PACS  Lungs:+ rales bil Abd:+ BS soft, non tender Ext:no edema, + varocosities    Lab Results:  Recent Labs  10/06/12 0545 10/06/12 0558 10/06/12 1627  WBC 16.0*  --  10.5  HGB 14.3 14.6 15.1*  HCT 42.2 43.0 43.0  PLT 258  --  287   BMET  Recent Labs  10/06/12 0558 10/06/12 1305 10/07/12 0026  NA 140  --  136  K 3.5  --  3.3*  CL 105  --  100  CO2  --   --  23  GLUCOSE 222*  --  140*  BUN 18  --  25*  CREATININE 0.90 0.82 1.09  CALCIUM  --   --  9.1    Recent Labs  10/06/12 1919 10/07/12 0014  TROPONINI 0.33* <0.30      Studies/Results: Ct Angio Chest Pe W/cm &/or Wo Cm  10/06/2012  *RADIOLOGY REPORT*  Clinical Data: Shortness of breath and tachycardia.  Hypoxia. History of hysterectomy.  CT ANGIOGRAPHY CHEST  Technique:  Multidetector CT imaging of the chest using the standard protocol during bolus administration of intravenous contrast. Multiplanar reconstructed images including MIPs were obtained and reviewed to evaluate the vascular anatomy.  Contrast: 80mL OMNIPAQUE IOHEXOL 350 MG/ML SOLN  Comparison: Plain films earlier today  Findings: Lung windows demonstrate moderate centrilobular emphysema.  Patchy bibasilar  atelectasis. Septal thickening at the lung bases, most consistent with pulmonary venous congestion.  Minimal right lower lobe subpleural nodularity, measuring up to 3 mm on image 49/series 5 A 6 mm right lower lobe nodule on image 67/series 5  Soft tissue windows:  The quality of this exam for evaluation of pulmonary embolism is good. No evidence of pulmonary embolism.  Bilateral breast implants.  Normal aortic caliber.  Mild cardiomegaly with small bilateral pleural effusions.  No pericardial effusion.  Subcarinal node measures 1.5 cm on image 42/series 4.  Prominent right infrahilar nodal tissue on image 49/series 4.  Limited abdominal imaging demonstrates old granulomatous disease in the liver.  Tiny left liver lobe cyst.  No acute osseous abnormality.  IMPRESSION:  1. No evidence of pulmonary embolism. 2.  Findings of mild congestive heart failure. 3.  Lung nodules up to 6 mm. Given the concurrent centrilobular emphysema, follow-up chest CT at 6  months is recommended.  This recommendation follows the consensus statement: "Guidelines for Management of Small Pulmonary Nodules Detected on CT Scans: A Statement from the Fleischner Society" as published in Radiology 2005; 237:395-400. Online at: DietDisorder.cz.  4.  Prominent thoracic nodes.  Favored to be related to congestive heart failure.  These can be reevaluated at follow-up.   Original Report Authenticated By: Jeronimo Greaves, M.D.    Dg Chest  Port 1 View  10/06/2012  *RADIOLOGY REPORT*  Clinical Data: Shortness of breath.  PORTABLE CHEST - 1 VIEW  Comparison: Chest radiograph performed 07/01/2005  Findings: The lungs are well-aerated.  Small bilateral pleural effusions are likely present.  Bibasilar airspace opacification is noted, with underlying vascular congestion.  Findings raise concern for pulmonary edema, though multifocal pneumonia could have a similar appearance.  No pneumothorax is seen.  The cardiomediastinal  silhouette is borderline enlarged.  No acute osseous abnormalities are seen.  IMPRESSION: Bibasilar airspace opacification, with underlying vascular congestion and borderline cardiomegaly.  Likely small bilateral pleural effusions.  Findings raise concern for pulmonary edema, though multifocal pneumonia could have a similar appearance.   Original Report Authenticated By: Tonia Ghent, M.D.     Medications: I have reviewed the patient's current medications. Marland Kitchen aspirin EC  81 mg Oral Daily  . carvedilol  3.125 mg Oral BID WC  . furosemide  40 mg Intravenous Daily  . isosorbide mononitrate  15 mg Oral Daily  . lisinopril  2.5 mg Oral Daily  . potassium chloride  10 mEq Oral Daily  . sodium chloride  3 mL Intravenous Q12H   Assessment/Plan: Principal Problem:   Acute exacerbation of CHF (congestive heart failure) rule out Mi Active Problems:   Lung nodule   Leukocytosis   LBBB (left bundle branch block), no old to compare  PLAN: one troponin mildly elevated, awaiting echo, pro bnp elevated. Hypokalemia replace  BP low, held imdur and ace   LOS: 1 day   Shanesha Bednarz R 10/07/2012, 10:55 AM

## 2012-10-07 NOTE — Progress Notes (Signed)
ANTICOAGULATION CONSULT NOTE  Pharmacy Consult for Heparin Indication: chest pain/ACS  No Known Allergies  Patient Measurements: Height: 5\' 2"  (157.5 cm) Weight: 120 lb 12.8 oz (54.795 kg) IBW/kg (Calculated) : 50.1 Heparin Dosing Weight: 55 kg  Vital Signs: Temp: 98.5 F (36.9 C) (03/09 2035) Temp src: Oral (03/09 2035) BP: 105/69 mmHg (03/09 2035) Pulse Rate: 108 (03/09 2035)  Labs:  Recent Labs  10/06/12 0545 10/06/12 0558 10/06/12 1305 10/06/12 1627 10/06/12 1919 10/07/12 0026  HGB 14.3 14.6  --  15.1*  --   --   HCT 42.2 43.0  --  43.0  --   --   PLT 258  --   --  287  --   --   LABPROT  --   --   --  13.1  --   --   INR  --   --   --  1.00  --   --   HEPARINUNFRC  --   --   --   --   --  0.25*  CREATININE  --  0.90 0.82  --   --   --   TROPONINI  --   --  <0.30  --  0.33*  --     Estimated Creatinine Clearance: 49.8 ml/min (by C-G formula based on Cr of 0.82).  Assessment: 72 YO female with SOB/ACS for heparin   Goal of Therapy:  Heparin level 0.3-0.7 units/ml Monitor platelets by anticoagulation protocol: Yes   Plan:  Heparin 1500 units IV bolus, then increase heparin 850 units/hr Follow-up am labs.  Geannie Risen, PharmD, BCPS   10/07/2012,1:03 AM

## 2012-10-07 NOTE — Progress Notes (Addendum)
ANTICOAGULATION CONSULT NOTE  Pharmacy Consult for Heparin Indication: chest pain/ACS  No Known Allergies  Patient Measurements: Height: 5\' 2"  (157.5 cm) Weight: 124 lb 4.8 oz (56.382 kg) IBW/kg (Calculated) : 50.1 Heparin Dosing Weight: 55 kg  Recent Labs  10/06/12 0545  10/06/12 0558  10/06/12 1305 10/06/12 1627 10/06/12 1919 10/07/12 0014 10/07/12 0026 10/07/12 1050  HGB 14.3  --  14.6  --   --  15.1*  --   --   --   --   HCT 42.2  --  43.0  --   --  43.0  --   --   --   --   PLT 258  --   --   --   --  287  --   --   --   --   LABPROT  --   --   --   --   --  13.1  --   --   --   --   INR  --   --   --   --   --  1.00  --   --   --   --   HEPARINUNFRC  --   --   --   --   --   --   --   --  0.25* 0.33  CREATININE  --   < > 0.90  --  0.82  --   --   --  1.09 1.47*  TROPONINI  --   --   --   < > <0.30  --  0.33* <0.30  --  <0.30  < > = values in this interval not displayed.  Estimated Creatinine Clearance: 27.8 ml/min (by C-G formula based on Cr of 1.47).  Assessment: 20 yoF with SOB/ACS continuing on IV heparin. Heparin level therapeutic at 0.33 today at rate of 850 units/hr. CBC stable. No issues with heparin line and no bleeding per RN.  Goal of Therapy:  Heparin level 0.3-0.7 units/ml Monitor platelets by anticoagulation protocol: Yes   Plan:  1) Continue IV heparin at 850 units/hr  2) Confirmation level this evening 3) Daily heparin level and CBC 4) Monitor signs/symtoms of bleeding  Benjaman Pott, PharmD, BCPS 10/07/2012   11:56 AM   Addendum:  Confirmatory heparin level therapeutic = 0.46, continue at current rate.   Bayard Hugger, PharmD, BCPS  Clinical Pharmacist  Pager: [phone]

## 2012-10-08 ENCOUNTER — Encounter (HOSPITAL_COMMUNITY)
Admission: EM | Disposition: A | Discharge status: Home Health | Payer: Self-pay | Source: Home / Self Care | Attending: Internal Medicine

## 2012-10-08 HISTORY — PX: OTHER SURGICAL HISTORY: SHX169

## 2012-10-08 HISTORY — PX: LEFT AND RIGHT HEART CATHETERIZATION WITH CORONARY ANGIOGRAM: SHX5449

## 2012-10-08 LAB — POCT I-STAT 3, ART BLOOD GAS (G3+)
Bicarbonate: 23.8 mEq/L (ref 20.0–24.0)
TCO2: 25 mmol/L (ref 0–100)
pH, Arterial: 7.366 (ref 7.350–7.450)
pO2, Arterial: 69 mmHg — ABNORMAL LOW (ref 80.0–100.0)

## 2012-10-08 LAB — POCT I-STAT 3, VENOUS BLOOD GAS (G3P V)
Acid-base deficit: 3 mmol/L — ABNORMAL HIGH (ref 0.0–2.0)
Bicarbonate: 23.6 mEq/L (ref 20.0–24.0)
TCO2: 25 mmol/L (ref 0–100)
pO2, Ven: 37 mmHg (ref 30.0–45.0)

## 2012-10-08 LAB — POCT ACTIVATED CLOTTING TIME: Activated Clotting Time: 143 seconds

## 2012-10-08 LAB — BASIC METABOLIC PANEL
BUN: 23 mg/dL (ref 6–23)
Chloride: 106 mEq/L (ref 96–112)
Creatinine, Ser: 1.02 mg/dL (ref 0.50–1.10)
GFR calc Af Amer: 63 mL/min — ABNORMAL LOW (ref >90)
GFR calc non Af Amer: 54 mL/min — ABNORMAL LOW (ref >90)
Glucose, Bld: 110 mg/dL — ABNORMAL HIGH (ref 70–99)

## 2012-10-08 SURGERY — LEFT AND RIGHT HEART CATHETERIZATION WITH CORONARY ANGIOGRAM
Anesthesia: LOCAL

## 2012-10-08 MED ORDER — HEPARIN (PORCINE) IN NACL 2-0.9 UNIT/ML-% IJ SOLN
INTRAMUSCULAR | Status: AC
  Filled 2012-10-08: qty 1000

## 2012-10-08 MED ORDER — ONDANSETRON HCL 4 MG/2ML IJ SOLN: 4.0000 mg | Freq: Four times a day (QID) | INTRAMUSCULAR | Status: DC | PRN

## 2012-10-08 MED ORDER — NITROGLYCERIN 1 MG/10 ML FOR IR/CATH LAB
INTRA_ARTERIAL | Status: AC
  Filled 2012-10-08: qty 10

## 2012-10-08 MED ORDER — FUROSEMIDE 20 MG PO TABS
20.0000 mg | ORAL_TABLET | Freq: Every day | ORAL | Status: DC
  Administered 2012-10-09 – 2012-10-10 (×2): 20 mg via ORAL
  Filled 2012-10-08 (×2): qty 1

## 2012-10-08 MED ORDER — SODIUM CHLORIDE 0.9 % IV SOLN
INTRAVENOUS | Status: DC
  Administered 2012-10-08: 50 mL/h via INTRAVENOUS

## 2012-10-08 MED ORDER — FENTANYL CITRATE 0.05 MG/ML IJ SOLN
INTRAMUSCULAR | Status: AC
  Filled 2012-10-08: qty 2

## 2012-10-08 MED ORDER — ACETAMINOPHEN 325 MG PO TABS: 650.0000 mg | ORAL_TABLET | ORAL | Status: DC | PRN

## 2012-10-08 MED ORDER — MIDAZOLAM HCL 2 MG/2ML IJ SOLN
INTRAMUSCULAR | Status: AC
  Filled 2012-10-08: qty 2

## 2012-10-08 MED ORDER — LIDOCAINE HCL (PF) 1 % IJ SOLN
INTRAMUSCULAR | Status: AC
  Filled 2012-10-08: qty 30

## 2012-10-08 NOTE — Progress Notes (Signed)
ANTICOAGULATION CONSULT NOTE -follow up consult Pharmacy Consult for Heparin Indication: chest pain/ACS  No Known Allergies  Patient Measurements: Height: 5\' 2"  (157.5 cm) Weight: 124 lb 4.8 oz (56.382 kg) IBW/kg (Calculated) : 50.1 Heparin Dosing Weight: 55 kg  Recent Labs  10/06/12 0545  10/06/12 0558  10/06/12 1305 10/06/12 1627 10/06/12 1919 10/07/12 0014  10/07/12 1050 10/07/12 1900 10/08/12 0455  HGB 14.3  --  14.6  --   --  15.1*  --   --   --   --  13.8  --   HCT 42.2  --  43.0  --   --  43.0  --   --   --   --  40.7  --   PLT 258  --   --   --   --  287  --   --   --   --  271  --   LABPROT  --   --   --   --   --  13.1  --   --   --   --  13.3  --   INR  --   --   --   --   --  1.00  --   --   --   --  1.02  --   HEPARINUNFRC  --   --   --   --   --   --   --   --   < > 0.33 0.46 0.65  CREATININE  --   < > 0.90  --  0.82  --   --   --   < > 1.47* 1.31* 1.02  TROPONINI  --   --   --   < > <0.30  --  0.33* <0.30  --  <0.30  --   --   < > = values in this interval not displayed.  Estimated Creatinine Clearance: 40 ml/min (by C-G formula based on Cr of 1.02).  Assessment: 30 yoF with AM heparin level of 0.65, therapeutic level on IV heparin drip 850 units/hr for SOB/ACS. CBC on 10/07/12 was stable. No bleeding noted. S/p cardiac cath today, impression per Dr. Tresa Endo noted as non-ischemic cardiomyopathy.  Currently Heparin orders remain active; not addressed per cardiologist post cath. I will call Dr. Tresa Endo to clarify.   Goal of Therapy:  Heparin level 0.3-0.7 units/ml Monitor platelets by anticoagulation protocol: Yes   Plan:  I called Dr. Tresa Endo- he gave order to discontinue the IV heparin drip.  Will dc the Daily heparin level and CBC  Thank You,  Noah Delaine, RPh Clinical Pharmacist Pager: [phone] 10/08/2012   3:29 PM

## 2012-10-08 NOTE — Progress Notes (Signed)
[facility] Progress Note  Subjective:  No chest pain  Objective:   Vital Signs in the last 24 hours: Temp:  [97.4 F (36.3 C)-98.5 F (36.9 C)] 98.5 F (36.9 C) (03/11 0515) Pulse Rate:  [90-106] 98 (03/11 0515) Resp:  [20] 20 (03/11 0515) BP: (90-117)/(57-86) 114/86 mmHg (03/11 0515) SpO2:  [93 %-96 %] 95 % (03/11 0515)  Intake/Output from previous day: 03/10 0701 - 03/11 0700 In: 1182.3 [P.O.:840; I.V.:102.3] Out: 800 [Urine:800]  Scheduled: . aspirin EC  81 mg Oral Daily  . carvedilol  3.125 mg Oral BID WC  . diazepam  5 mg Oral On Call  . furosemide  40 mg Intravenous Daily  . isosorbide mononitrate  15 mg Oral Daily  . lisinopril  2.5 mg Oral Daily  . potassium chloride  10 mEq Oral Daily  . sodium chloride  3 mL Intravenous Q12H  . sodium chloride  3 mL Intravenous Q12H   . sodium chloride 1 mL/kg/hr (10/08/12 0411)  . heparin 850 Units/hr (10/07/12 1825)   Physical Exam:   General appearance: alert, cooperative and no distress Neck: no JVD Lungs: clear to auscultation bilaterally Heart: regular rate and rhythm and 1/6 sem Abdomen: soft, non-tender; bowel sounds normal; no masses,  no organomegaly Extremities: no edema, redness or tenderness in the calves or thighs   Rate: 90  Rhythm: normal sinus rhythm; LBBB  Lab Results:    Recent Labs  10/07/12 1900 10/08/12 0455  NA 139 138  K 3.7 4.2  CL 102 106  CO2 26 24  GLUCOSE 119* 110*  BUN 26* 23  CREATININE 1.31* 1.02    Recent Labs  10/07/12 0014 10/07/12 1050  TROPONINI <0.30 <0.30   Hepatic Function Panel No results found for this basename: PROT, ALBUMIN, AST, ALT, ALKPHOS, BILITOT, BILIDIR, IBILI,  in the last 72 hours  Recent Labs  10/07/12 1900  INR 1.02    Lipid Panel  No results found for this basename: chol, trig, hdl, cholhdl, vldl, ldlcalc     Imaging:  Ct Angio Chest Pe W/cm &/or Wo Cm  10/06/2012  *RADIOLOGY REPORT*  Clinical Data:  Shortness of breath and tachycardia.  Hypoxia. History of hysterectomy.  CT ANGIOGRAPHY CHEST  Technique:  Multidetector CT imaging of the chest using the standard protocol during bolus administration of intravenous contrast. Multiplanar reconstructed images including MIPs were obtained and reviewed to evaluate the vascular anatomy.  Contrast: 80mL OMNIPAQUE IOHEXOL 350 MG/ML SOLN  Comparison: Plain films earlier today  Findings: Lung windows demonstrate moderate centrilobular emphysema.  Patchy bibasilar atelectasis. Septal thickening at the lung bases, most consistent with pulmonary venous congestion.  Minimal right lower lobe subpleural nodularity, measuring up to 3 mm on image 49/series 5 A 6 mm right lower lobe nodule on image 67/series 5  Soft tissue windows:  The quality of this exam for evaluation of pulmonary embolism is good. No evidence of pulmonary embolism.  Bilateral breast implants.  Normal aortic caliber.  Mild cardiomegaly with small bilateral pleural effusions.  No pericardial effusion.  Subcarinal node measures 1.5 cm on image 42/series 4.  Prominent right infrahilar nodal tissue on image 49/series 4.  Limited abdominal imaging demonstrates old granulomatous disease in the liver.  Tiny left liver lobe cyst.  No acute osseous abnormality.  IMPRESSION:  1. No evidence of pulmonary embolism. 2.  Findings of mild congestive heart failure. 3.  Lung nodules up to 6 mm. Given the concurrent centrilobular emphysema, follow-up chest  CT at 6  months is recommended.  This recommendation follows the consensus statement: "Guidelines for Management of Small Pulmonary Nodules Detected on CT Scans: A Statement from the Fleischner Society" as published in Radiology 2005; 237:395-400. Online at: DietDisorder.cz.  4.  Prominent thoracic nodes.  Favored to be related to congestive heart failure.  These can be reevaluated at follow-up.   Original Report Authenticated By: Jeronimo Greaves, M.D.     Echo  Study Conclusions  - Procedure narrative: Transthoracic echocardiography. Technically difficult study with limited acoustic windows. - Left ventricle: The cavity size was normal. There was moderate concentric hypertrophy. The estimated ejection fraction was 15%. Severe global hypokinesis with anteroseptal and apicalakinesis, thinning and enhanced echogenicity, suggesting possible LAD territory infarct and multivessel ischemia. Doppler parameters are consistent with abnormal left ventricular relaxation (grade 1 diastolic dysfunction). The E/e' ratio is >10, suggesting elevated LV filling pressure. - Aortic valve: Sclerosis without stenosis. There was no stenosis. No regurgitation. - Mitral valve: Mildly thickened leaflets . Trivial regurgitation. - Right ventricle: The moderator band was prominent. Systolic function was normal. - Atrial septum: Bowing of the IAS from left to right, suggests high LA pressure. - Inferior vena cava: The vessel was normal in size; the respirophasic diameter changes were in the normal range (= 50%); findings are consistent with normal central venous pressure. - Pericardium, extracardiac: A trivial pericardial effusion was identified.    Assessment/Plan:   Principal Problem:   Acute exacerbation of CHF (congestive heart failure) rule out Mi Active Problems:   Lung nodule   Leukocytosis   LBBB (left bundle branch block), no old to compare  Discussed R and L cath with patient and family. They understand risks/benefits. Plan this am.    Lennette Bihari, MD, [facility] 10/08/2012, 8:35 AM

## 2012-10-08 NOTE — Care Management Note (Signed)
    Page 1 of 2   10/10/2012     2:14:53 PM   CARE MANAGEMENT NOTE 10/10/2012  Patient:  Dominique Hernandez, Dominique Hernandez   Account Number:  [id number]  Date Initiated:  10/08/2012  Documentation initiated by:  GRAVES-BIGELOW,BRENDA  Subjective/Objective Assessment:   Pt admitted with cp and chf.     Action/Plan:   Cm will continue to monitor for disposition needs.   Anticipated DC Date:  10/11/2012   Anticipated DC Plan:  HOME W HOME HEALTH SERVICES      DC Planning Services  CM consult      [facility] Choice  HOME HEALTH   Choice offered to / List presented to:  C-1 Patient   DME arranged  VEST - LIFE VEST      DME agency  OTHER - SEE NOTE     HH arranged  HH-1 RN  HH-10 DISEASE MANAGEMENT      HH agency  [facility]   Status of service:  Completed, signed off Medicare Important Message given?   (If response is "NO", the following Medicare IM given date fields will be blank) Date Medicare IM given:   Date Additional Medicare IM given:    Discharge Disposition:  HOME W HOME HEALTH SERVICES  Per UR Regulation:  Reviewed for med. necessity/level of care/duration of stay  If discussed at Long Length of Stay Meetings, dates discussed:    Comments:  10-10-12 1412 Tom[address], [place] [phone] CM did speak to family and plan is for home with [facility] for CHF management. Pt and family agreeable to services with [facility]. SOC to begin within 24-48 hours post d/c. Life vest in the room from [place]. No further needs from CM at this time.   10-08-12 [address], [place] [phone] CM will f/u for disposition needs.

## 2012-10-08 NOTE — CV Procedure (Signed)
Dominique Hernandez is a 72 y.o. female    [id number]  [id number] LOCATION:  FACILITY: [facility]  PHYSICIAN: Lennette Bihari, M.D., [facility] [birth year]   DATE OF PROCEDURE:  10/08/2012  DATE OF DISCHARGE:  [facility]  CARDIAC CATHETERIZATION     History obtained from patient and chart. 72 yo WF admitted with progressive increase in shortness of breath.   PROCEDURE DESCRIPTION:  R and L Heart Cath   The patient was brought to the second floor  [facility] Cardiac cath lab in the postabsorptive state. She was premedicated with Versed 2 mg and 25 micrograms of fentanyl. Her Right groin was prepped and shaved in usual sterile fashion. Xylocaine 1% was used  for local anesthesia. A 5 French sheath was inserted into the right FA and 7 French sheath into the RFV. A Swan Ganz catheter was advanced into the R PA with hemodynamic pressure monitoring. Cardiac Outputs were obtained by thermodilution and assumed Fick methods. Coronary angiography was done with 5 F Left, Right, and No Torque catheters. 200 micrograms IC NTG was selectively administered in RCA to abolish catheter induced spasm. 5 F pigtail catheter was used for RAO ventriculography. Pt tolerated the procedure well.  HEMODYNAMICS:    RA: 8 RV 25/9    AO: 85/50 PA: 25/12    LV: 85/6/12 PC: mean 10  ANGIOGRAPHIC RESULTS:   1. Left main: nl  2. LAD: mild 20% proximally in region of Dx take-off 3. Left circumflex: nl dominant 4. Right coronary artery: non-dominant, catheter induced spasm, normalized with IC NTG  5.  Left ventriculography; RAO left ventriculogram was performed using  23 mL of Omnipaque  dye at 12 mL/second. The overall LVEF estimated 15 - 20%  with severe global dysfunction.   IMPRESSION:  Non-ischemic cardiomyopathy.  Lennette Bihari, MD, [facility] 10/08/2012 10:28 AM

## 2012-10-09 DIAGNOSIS — I428 Other cardiomyopathies: Secondary | ICD-10-CM | POA: Diagnosis present

## 2012-10-09 DIAGNOSIS — IMO0001 Reserved for inherently not codable concepts without codable children: Secondary | ICD-10-CM

## 2012-10-09 LAB — BASIC METABOLIC PANEL
CO2: 23 mEq/L (ref 19–32)
Calcium: 9.1 mg/dL (ref 8.4–10.5)
Chloride: 107 mEq/L (ref 96–112)
Glucose, Bld: 96 mg/dL (ref 70–99)
Sodium: 141 mEq/L (ref 135–145)

## 2012-10-09 MED ORDER — TRAMADOL HCL 50 MG PO TABS: 50.0000 mg | ORAL_TABLET | Freq: Four times a day (QID) | ORAL | Status: DC | PRN

## 2012-10-09 MED ORDER — ZOLPIDEM TARTRATE 5 MG PO TABS: 5.0000 mg | ORAL_TABLET | Freq: Every evening | ORAL | Status: DC | PRN

## 2012-10-09 MED ORDER — ALPRAZOLAM 0.25 MG PO TABS: 0.2500 mg | ORAL_TABLET | Freq: Three times a day (TID) | ORAL | Status: DC | PRN

## 2012-10-09 MED ORDER — LISINOPRIL 5 MG PO TABS
5.0000 mg | ORAL_TABLET | Freq: Every day | ORAL | Status: DC
  Administered 2012-10-10: 5 mg via ORAL
  Filled 2012-10-09: qty 1

## 2012-10-09 MED ORDER — CARVEDILOL 6.25 MG PO TABS
6.2500 mg | ORAL_TABLET | Freq: Two times a day (BID) | ORAL | Status: DC
  Administered 2012-10-09 – 2012-10-10 (×2): 6.25 mg via ORAL
  Filled 2012-10-09 (×4): qty 1

## 2012-10-09 MED ORDER — LISINOPRIL 2.5 MG PO TABS
2.5000 mg | ORAL_TABLET | Freq: Two times a day (BID) | ORAL | Status: AC
  Administered 2012-10-09: 2.5 mg via ORAL
  Filled 2012-10-09: qty 1

## 2012-10-09 NOTE — Progress Notes (Signed)
Subjective:  No SOB  Objective:  Vital Signs in the last 24 hours: Temp:  [98 F (36.7 C)-98.6 F (37 C)] 98 F (36.7 C) (03/12 0901) Pulse Rate:  [95-115] 100 (03/12 0901) Resp:  [18] 18 (03/12 0901) BP: (103-129)/(60-76) 128/60 mmHg (03/12 0901) SpO2:  [91 %-96 %] 94 % (03/12 0901) Weight:  [58.968 kg (130 lb)] 58.968 kg (130 lb) (03/12 0400)  Intake/Output from previous day:  Intake/Output Summary (Last 24 hours) at 10/09/12 1228 Last data filed at 10/09/12 1224  Gross per 24 hour  Intake      0 ml  Output   1550 ml  Net  -1550 ml    Physical Exam: General appearance: alert, cooperative and no distress Lungs: clear to auscultation bilaterally Heart: regular rate and rhythm RT groin without hematoma   Rate: 95  Rhythm: sinus tachycardia  Lab Results:  Recent Labs  10/06/12 1627 10/07/12 1900  WBC 10.5 8.7  HGB 15.1* 13.8  PLT 287 271    Recent Labs  10/08/12 0455 10/09/12 0605  NA 138 141  K 4.2 4.2  CL 106 107  CO2 24 23  GLUCOSE 110* 96  BUN 23 14  CREATININE 1.02 0.88    Recent Labs  10/07/12 0014 10/07/12 1050  TROPONINI <0.30 <0.30   Hepatic Function Panel No results found for this basename: PROT, ALBUMIN, AST, ALT, ALKPHOS, BILITOT, BILIDIR, IBILI,  in the last 72 hours No results found for this basename: CHOL,  in the last 72 hours  Recent Labs  10/07/12 1900  INR 1.02    Imaging: Imaging results have been reviewed  Cardiac Studies:  Assessment/Plan:   Principal Problem:   Acute systolic congestive heart failure Active Problems:   Nonischemic cardiomyopathy- EF 15-20%   LBBB (left bundle branch block), no old to compare   Lung nodule, seen on CT 10/07/12- needs follow up in 6 mos   Leukocytosis   Normal coronary arteries 10/08/12  Plan- Increase Coreg to 6.25mg  BID. Increase Lisinopril to 5mg  daily. Continue low dose Lasix and Imdur. ? Home 24hrs. She will need LifeVest and I will contact them.    Corine Shelter  PA-C 10/09/2012, 12:28 PM

## 2012-10-09 NOTE — Progress Notes (Signed)
Pt. Seen and examined. Agree with the NP/PA-C note as written.  Agree with uptitrating heart failure meds. Plan to contact lifevest, hopeful placement tomorrow. Will be okay for d/c thereafter. Lasix dose prn for weight gain as she is fairly well compensated.  Chrystie Nose, MD, [facility] Attending Cardiologist [facility]

## 2012-10-10 LAB — BASIC METABOLIC PANEL
BUN: 17 mg/dL (ref 6–23)
CO2: 24 mEq/L (ref 19–32)
Calcium: 9.7 mg/dL (ref 8.4–10.5)
Chloride: 103 mEq/L (ref 96–112)
Creatinine, Ser: 0.96 mg/dL (ref 0.50–1.10)
GFR calc Af Amer: 67 mL/min — ABNORMAL LOW (ref >90)
GFR calc non Af Amer: 58 mL/min — ABNORMAL LOW (ref >90)
Glucose, Bld: 103 mg/dL — ABNORMAL HIGH (ref 70–99)
Potassium: 4 mEq/L (ref 3.5–5.1)
Sodium: 139 mEq/L (ref 135–145)

## 2012-10-10 LAB — PRO B NATRIURETIC PEPTIDE: Pro B Natriuretic peptide (BNP): 1942 pg/mL — ABNORMAL HIGH (ref 0–125)

## 2012-10-10 MED ORDER — ISOSORBIDE MONONITRATE 15 MG HALF TABLET: 15.0000 mg | ORAL_TABLET | Freq: Every day | ORAL | Status: DC

## 2012-10-10 MED ORDER — CARVEDILOL 6.25 MG PO TABS: 6.2500 mg | ORAL_TABLET | Freq: Two times a day (BID) | ORAL | Status: DC

## 2012-10-10 MED ORDER — LISINOPRIL 5 MG PO TABS: 5.0000 mg | ORAL_TABLET | Freq: Every day | ORAL | Status: DC

## 2012-10-10 MED ORDER — FUROSEMIDE 20 MG PO TABS: 20.0000 mg | ORAL_TABLET | Freq: Every day | ORAL | Status: DC

## 2012-10-10 MED ORDER — ASPIRIN 81 MG PO TBEC: 81.0000 mg | DELAYED_RELEASE_TABLET | Freq: Every day | ORAL | Status: DC

## 2012-10-10 MED ORDER — POTASSIUM CHLORIDE CRYS ER 10 MEQ PO TBCR: 10.0000 meq | EXTENDED_RELEASE_TABLET | Freq: Every day | ORAL | Status: DC

## 2012-10-10 MED ORDER — ACETAMINOPHEN 325 MG PO TABS: 650.0000 mg | ORAL_TABLET | ORAL | Status: DC | PRN

## 2012-10-10 NOTE — Discharge Summary (Signed)
Patient ID: Dominique Hernandez,  MRN: [id number], DOB/AGE: [birth year] 72 y.o.  Admit date: 10/06/2012 Discharge date: 10/10/2012  Primary Care Rilee Wendling:   Dr Burnell Blanks Primary Cardiologist: Dr Allyson Sabal  Discharge Diagnoses Principal Problem:   Acute systolic congestive heart failure Active Problems:   Nonischemic cardiomyopathy- EF 15-20%   LBBB (left bundle branch block), no old to compare   Lung nodule, seen on CT 10/07/12- needs follow up in 6 mos   Leukocytosis   Normal coronary arteries 10/08/12    Procedures: Cardiac Cath 10/08/12   Hospital Course:  Pleasant 72 y/o female with no prior history of CAD or CHF. She presented to the ER 10/06/12 with dyspnea.  Her EKG showed LBBB and she had no old EKGs to compare. Chest CT c/w CHF as was her exam and BNP.  She was Rx'd with diuretics, beta blocker, and ACE as her B/P would allow. Echo revealed an EF of 15% with severe global HK.  There was also some ant wall thinning suggesting possible LAD disease.  She was set up for Rt and Lt heart cath which was done 10/08/12. This revealed no significant CAD. Her medications were optimized and she was fitted for a LifeVest. We feel she can be discharged 10/10/12. She will see a MLP in 5-7 days as she is a TCM pt. We may be able to increase her medications as an OP. We may also be able to change her Lasix to prn.   Discharge Vitals:  Blood pressure 125/69, pulse 87, temperature 98.3 F (36.8 C), temperature source Oral, resp. rate 18, height 5\' 2"  (1.575 m), weight 56.881 kg (125 lb 6.4 oz), SpO2 97.00%.    Labs: Results for orders placed during the hospital encounter of 10/06/12 (from the past 48 hour(s))  BASIC METABOLIC PANEL     Status: Abnormal   Collection Time    10/09/12  6:05 AM      Result Value Range   Sodium 141  135 - 145 mEq/L   Potassium 4.2  3.5 - 5.1 mEq/L   Chloride 107  96 - 112 mEq/L   CO2 23  19 - 32 mEq/L   Glucose, Bld 96  70 - 99 mg/dL   BUN 14  6 - 23 mg/dL   Creatinine, Ser 4.09  0.50 - 1.10 mg/dL   Calcium 9.1  8.4 - 81.1 mg/dL   GFR calc non Af Amer 65 (*) >90 mL/min   GFR calc Af Amer 75 (*) >90 mL/min   Comment:            The eGFR has been calculated     using the CKD EPI equation.     This calculation has not been     validated in all clinical     situations.     eGFR's persistently     <90 mL/min signify     possible Chronic Kidney Disease.  BASIC METABOLIC PANEL     Status: Abnormal   Collection Time    10/10/12  6:12 AM      Result Value Range   Sodium 139  135 - 145 mEq/L   Potassium 4.0  3.5 - 5.1 mEq/L   Chloride 103  96 - 112 mEq/L   CO2 24  19 - 32 mEq/L   Glucose, Bld 103 (*) 70 - 99 mg/dL   BUN 17  6 - 23 mg/dL   Creatinine, Ser 9.14  0.50 - 1.10 mg/dL   Calcium 9.7  8.4 - 10.5 mg/dL   GFR calc non Af Amer 58 (*) >90 mL/min   GFR calc Af Amer 67 (*) >90 mL/min   Comment:            The eGFR has been calculated     using the CKD EPI equation.     This calculation has not been     validated in all clinical     situations.     eGFR's persistently     <90 mL/min signify     possible Chronic Kidney Disease.  PRO B NATRIURETIC PEPTIDE     Status: Abnormal   Collection Time    10/10/12  6:12 AM      Result Value Range   Pro B Natriuretic peptide (BNP) 1942.0 (*) 0 - 125 pg/mL    Disposition:      Follow-up Information   Follow up with Corine Shelter K, PA-C. (office will call you)    Contact information:   [address] [place] [address] [phone]       Discharge Medications:    Medication List    TAKE these medications       acetaminophen 325 MG tablet  Commonly known as:  TYLENOL  Take 2 tablets (650 mg total) by mouth every 4 (four) hours as needed.     aspirin 81 MG EC tablet  Take 1 tablet (81 mg total) by mouth daily.     CALCIUM + D PO  Take 1 tablet by mouth daily.     carvedilol 6.25 MG tablet  Commonly known as:  COREG  Take 1 tablet (6.25 mg total) by mouth 2  (two) times daily with a meal.     FISH OIL PO  Take 1 capsule by mouth daily.     furosemide 20 MG tablet  Commonly known as:  LASIX  Take 1 tablet (20 mg total) by mouth daily.     isosorbide mononitrate 15 mg Tb24  Commonly known as:  IMDUR  Take 0.5 tablets (15 mg total) by mouth daily.     lisinopril 5 MG tablet  Commonly known as:  PRINIVIL,ZESTRIL  Take 1 tablet (5 mg total) by mouth daily.     potassium chloride 10 MEQ tablet  Commonly known as:  K-DUR,KLOR-CON  Take 1 tablet (10 mEq total) by mouth daily.         Duration of Discharge Encounter: Greater than 45 minutes including physician time.  Signed, Corine Shelter PA-C 10/10/2012 12:24 PM  Discussed signs and symptoms of CHF exacerbation, need for daily weight monitoring and sodium restriction, activity limitations and purpose and use of LifeVest. Also had a lengthy discussion re: meds for CHF, prognosis, EF monitoring and possible need for CRT+/-D therapy after 90 days of medical Rx.  Well over 40 minutes spent in direct education and evaluation and answering her questions and those of her spouse.  Agree she is a TCM patient: she has a complicated illness and fragile hemodynamic status. Plan early and frequent follow up.   Thurmon Fair, MD, [facility] [phone] 10/10/2012, 1:57 PM

## 2012-10-10 NOTE — Progress Notes (Signed)
Reviewed CHF education again with pt and husband, pt and husband able to do teach back with nurse on her education of how to care for her CHF.

## 2012-10-10 NOTE — Progress Notes (Signed)
D/c instructions reviewed with pt and her husband. Copy of instructions, further education on CHF provided, and few scripts (others were electronically sent to pt's pharmacy.) were given to pt. All questions answered. Pt going home with life vest on, pt's husband was educated by the provider last evening, and knows how to apply and has handouts also from vest provider. At 1535 pt d/c'd  Via wheelchair with belongings, with life vest on, and with family, pt escorted by unit NT.

## 2012-10-10 NOTE — Progress Notes (Signed)
Subjective:  No SOB  Objective:  Vital Signs in the last 24 hours: Temp:  [97.7 F (36.5 C)-98.3 F (36.8 C)] 98.3 F (36.8 C) (03/13 0500) Pulse Rate:  [73-94] 87 (03/13 0500) Resp:  [18] 18 (03/12 1600) BP: (108-131)/(63-83) 125/69 mmHg (03/13 0500) SpO2:  [94 %-97 %] 97 % (03/13 0500) Weight:  [56.881 kg (125 lb 6.4 oz)] 56.881 kg (125 lb 6.4 oz) (03/13 0500)  Intake/Output from previous day:  Intake/Output Summary (Last 24 hours) at 10/10/12 1205 Last data filed at 10/10/12 1000  Gross per 24 hour  Intake    680 ml  Output   2450 ml  Net  -1770 ml    Physical Exam: General appearance: alert, cooperative and no distress Lungs: clear to auscultation bilaterally Heart: regular rate and rhythm   Rate: 84  Rhythm: normal sinus rhythm  Lab Results:  Recent Labs  10/07/12 1900  WBC 8.7  HGB 13.8  PLT 271    Recent Labs  10/09/12 0605 10/10/12 0612  NA 141 139  K 4.2 4.0  CL 107 103  CO2 23 24  GLUCOSE 96 103*  BUN 14 17  CREATININE 0.88 0.96   No results found for this basename: TROPONINI, CK, MB,  in the last 72 hours Hepatic Function Panel No results found for this basename: PROT, ALBUMIN, AST, ALT, ALKPHOS, BILITOT, BILIDIR, IBILI,  in the last 72 hours No results found for this basename: CHOL,  in the last 72 hours  Recent Labs  10/07/12 1900  INR 1.02    Imaging: Imaging results have been reviewed  Cardiac Studies:  Assessment/Plan:   Principal Problem:   Acute systolic congestive heart failure Active Problems:   Nonischemic cardiomyopathy- EF 15-20%   LBBB (left bundle branch block), no old to compare   Lung nodule, seen on CT 10/07/12- needs follow up in 6 mos   Leukocytosis   Normal coronary arteries 10/08/12  Plan- Her BNP continues to come down. LifeVest has been  fitted.  Will discuss with MD- I believe she is a TCM pt and will require early follow up in 5-7 days.     Corine Shelter PA-C 10/10/2012, 12:05 PM   I have seen and  examined the patient along with Corine Shelter PA-C.  I have reviewed the chart, notes and new data.  I agree with PA's note.  Key new complaints: no dyspnea at rest or with light walking Key examination changes: no JVD, rales, S3/4 or edema; prominent split S2. BP 91.64 mm hg when I was seeing her, butshe is not weak or dizzy even when standing. Key new findings / data: Note LBBB with QRS .130 ms  PLAN: DC home today. Discussed signs and symptoms of CHF exacerbation, need for daily weight monitoring and sodium restriction, activity limitations and purpose and use of LifeVest. Also had a lengthy discussion re: meds for CHF, prognosis, EF monitoring and possible need for CRT+/-D therapy after 90 days of medical Rx. Well over 40 minutes spent in direct education and evaluation and answering her questions and those of her spouse. Agree she is a TCM patient: she has a complicated illness and fragile hemodynamic status. Plan early and frequent follow up.  Thurmon Fair, MD, [facility] [phone] 10/10/2012, 1:52 PM

## 2012-11-19 ENCOUNTER — Other Ambulatory Visit (HOSPITAL_COMMUNITY): Payer: Self-pay | Admitting: Cardiology

## 2012-11-19 DIAGNOSIS — I509 Heart failure, unspecified: Secondary | ICD-10-CM

## 2012-11-19 DIAGNOSIS — I255 Ischemic cardiomyopathy: Secondary | ICD-10-CM

## 2013-01-03 ENCOUNTER — Ambulatory Visit (HOSPITAL_COMMUNITY)
Admission: RE | Admit: 2013-01-03 | Discharge: 2013-01-03 | Disposition: A | Discharge status: Home / Self Care | Payer: Medicare Other | Source: Ambulatory Visit | Attending: Cardiology | Admitting: Cardiology

## 2013-01-03 DIAGNOSIS — I509 Heart failure, unspecified: Secondary | ICD-10-CM | POA: Insufficient documentation

## 2013-01-03 DIAGNOSIS — I379 Nonrheumatic pulmonary valve disorder, unspecified: Secondary | ICD-10-CM | POA: Insufficient documentation

## 2013-01-03 DIAGNOSIS — I447 Left bundle-branch block, unspecified: Secondary | ICD-10-CM | POA: Insufficient documentation

## 2013-01-03 DIAGNOSIS — I255 Ischemic cardiomyopathy: Secondary | ICD-10-CM

## 2013-01-03 DIAGNOSIS — I2589 Other forms of chronic ischemic heart disease: Secondary | ICD-10-CM | POA: Insufficient documentation

## 2013-01-03 DIAGNOSIS — Z87891 Personal history of nicotine dependence: Secondary | ICD-10-CM | POA: Insufficient documentation

## 2013-01-03 DIAGNOSIS — I079 Rheumatic tricuspid valve disease, unspecified: Secondary | ICD-10-CM | POA: Insufficient documentation

## 2013-01-03 DIAGNOSIS — I059 Rheumatic mitral valve disease, unspecified: Secondary | ICD-10-CM | POA: Insufficient documentation

## 2013-01-03 NOTE — Progress Notes (Signed)
[facility]   2D echo completed 01/03/2013.   Cindy Coleson Kant, RDCS  

## 2013-01-08 ENCOUNTER — Telehealth: Payer: Self-pay | Admitting: Cardiovascular Disease

## 2013-01-08 NOTE — Telephone Encounter (Signed)
Wants Echo results from 01-03-13-sw

## 2013-01-08 NOTE — Telephone Encounter (Signed)
Better than expected improvement in EF from 15% to 35-40%, so we may be able to avoid ICD. Will discuss at next office visit.

## 2013-01-08 NOTE — Telephone Encounter (Signed)
Returned call to pt's husband, Kenyon Ana.  Informed results have not been reviewed by MD/PA and nurse will call after they are reviewed.  Verbalized understanding and agreed w/ plan.

## 2013-01-09 ENCOUNTER — Telehealth: Payer: Self-pay | Admitting: Cardiovascular Disease

## 2013-01-09 ENCOUNTER — Ambulatory Visit: Payer: Medicare Other | Admitting: Cardiovascular Disease

## 2013-01-09 NOTE — Telephone Encounter (Signed)
Pt's husband called wanting to know about her test results. He said that he was supposed to get a call but no one called him yet.

## 2013-01-09 NOTE — Telephone Encounter (Signed)
Mr.Schmader is calling about Dominique Hernandez Test results and is wanting to know when can she take the vest off that she has been wearing .Marland Kitchen Please call   Thanks

## 2013-01-10 NOTE — Telephone Encounter (Signed)
Call to pt and spoke w/ husband, Kenyon Ana.  Results given per MD.  Verbalized understanding and will wait for further discussion at appt next week.

## 2013-01-10 NOTE — Telephone Encounter (Signed)
Still waiting to get test results!

## 2013-01-11 ENCOUNTER — Encounter: Payer: Self-pay | Admitting: *Deleted

## 2013-01-15 ENCOUNTER — Telehealth: Payer: Self-pay | Admitting: Cardiovascular Disease

## 2013-01-15 ENCOUNTER — Encounter: Payer: Self-pay | Admitting: Cardiovascular Disease

## 2013-01-15 NOTE — Telephone Encounter (Signed)
He wants to know if  you received fax for the order of her life vest?

## 2013-01-15 NOTE — Telephone Encounter (Signed)
Forwarded to B. Lassiter, CMA.  

## 2013-01-16 ENCOUNTER — Encounter: Payer: Self-pay | Admitting: Cardiovascular Disease

## 2013-01-16 ENCOUNTER — Ambulatory Visit (INDEPENDENT_AMBULATORY_CARE_PROVIDER_SITE_OTHER): Payer: Medicare Other | Admitting: Cardiovascular Disease

## 2013-01-16 VITALS — BP 132/80 | HR 72 | Resp 16 | Ht 61.0 in | Wt 123.1 lb

## 2013-01-16 DIAGNOSIS — I428 Other cardiomyopathies: Secondary | ICD-10-CM

## 2013-01-16 DIAGNOSIS — R911 Solitary pulmonary nodule: Secondary | ICD-10-CM

## 2013-01-16 DIAGNOSIS — I509 Heart failure, unspecified: Secondary | ICD-10-CM

## 2013-01-16 DIAGNOSIS — I5042 Chronic combined systolic (congestive) and diastolic (congestive) heart failure: Secondary | ICD-10-CM

## 2013-01-16 MED ORDER — CARVEDILOL 12.5 MG PO TABS: 12.5000 mg | ORAL_TABLET | Freq: Two times a day (BID) | ORAL | Status: DC

## 2013-01-16 NOTE — Patient Instructions (Addendum)
Your physician recommends that you schedule a follow-up appointment in: 6 weeks. Your physician has recommended you make the following change in your medication: carvedilol 12.5 mg twice a day You no longer need to wear the Life Vest

## 2013-01-16 NOTE — Assessment & Plan Note (Signed)
Currently, she appears euvolemic and has functional class I on a very low dose of loop diuretics. This suggests good prognosis

## 2013-01-16 NOTE — Assessment & Plan Note (Signed)
She appears to have idiopathic dilated cardiomyopathy. Normal coronary arteries angiographically. She has shown a remarkably positive response to medical therapy with ACE inhibitors and carvedilol in her ejection fraction has increased from 15% to 35-40%. She has been wearing a life vest now for 3 months: the device has not reported any episodes of nonsustained arrhythmia and has not delivered therapy. She has excellent functional status (NYHA class I). I think all of these findings of label her as non-critical risk of sudden arrhythmic death and I have recommended that she could discontinue the life vest. Do not think she currently requires cardiac rhythm device therapy.  She does have a left bundle branch block but only a moderately prolonged QRS duration of around 128 ms. Since her heart failure is so well compensated she does not require CRT therapy at this time. This may be a necessary option in the future if her cardiomyopathy deteriorates.  The mainstay of therapy will be further gradual titration of heart failure medications. Have asked her to increase her carvedilol to 12.5 mg twice a day. She may express transient worsening for a week or 2. I think it is premature to consider discontinuation of diuretic therapy, at least until we have finished with beta blocker titration.  If her blood pressure tolerates it there is also room to increase her ACE inhibitor at her upcoming appointment.

## 2013-01-16 NOTE — Progress Notes (Signed)
Patient ID: Dominique Hernandez, female   DOB: Jul 25, 1941, 72 y.o.   MRN: [id number]      Reason for office visit CHF followup  The patient is 72 years old and presented with acute heart failure in March of this year. She did not have any significant risk factors for structural heart disease. Her left ventricular ejection fraction was estimated to be around 15% due to global hypokinesis. Coronary angiography showed normal arteries. She was treated with ACE inhibitors, beta blockers and has been wearing a life vest for the last 3 months.  She has shown remarkable clinical improvement. Chest not require rehospitalization. She walks between one and a half to 2 miles a day almost everyday of the week. Her 42 year old granddaughter states that her grandmother works very fast. She has trouble keeping up with her sometimes.  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 Her husband is doing a very meticulous job of origin her blood pressure, heart rate in weight. She has not had problems with significant hypotension edema or any dyspnea at rest.  Her life this has not reported any episodes of nonsustained tachycardia and she has not required defibrillator therapy.  An echocardiogram performed about a week ago shows remarkable improvement of left ventricular ejection fraction which is now up to 35%.    No Known Allergies  Current Outpatient Prescriptions  Medication Sig Dispense Refill  . acetaminophen (TYLENOL) 325 MG tablet Take 2 tablets (650 mg total) by mouth every 4 (four) hours as needed.      Marland Kitchen aspirin EC 81 MG EC tablet Take 1 tablet (81 mg total) by mouth daily.      . Calcium Carbonate-Vitamin D (CALCIUM + D PO) Take 1 tablet by mouth daily.      . carvedilol (COREG) 12.5 MG tablet Take 1 tablet (12.5 mg total) by mouth 2 (two) times daily with a meal.  60 tablet  11  . furosemide (LASIX) 20 MG tablet Take 1 tablet (20 mg  total) by mouth daily.  30 tablet  11  . lisinopril (PRINIVIL,ZESTRIL) 5 MG tablet Take 5 mg by mouth 2 (two) times daily.      . Omega-3 Fatty Acids (FISH OIL PO) Take 1 capsule by mouth daily.      . potassium chloride (K-DUR,KLOR-CON) 10 MEQ tablet Take 1 tablet (10 mEq total) by mouth daily.  30 tablet  11   No current facility-administered medications for this visit.    Past Medical History  Diagnosis Date  . Nonischemic cardiomyopathy   . LBBB (left bundle branch block)   . Lung nodule     RLL    Past Surgical History  Procedure Laterality Date  . Abdominal hysterectomy    . Breast surgery      breast implants  . Bladder surgery    . Right & left heart cath  10/08/2012    20% LAD,catheter induced spasm RCA,EF 15-20%    Family History  Problem Relation Age of Onset  . CAD Mother   . Other Father 69    rocky mountain spotted fever  . CAD Sister   . CAD Brother   . CAD Sister   . CAD Sister   . CAD Sister     History   Social History  . Marital Status: Married    Spouse Name: N/A    Number of Children: N/A  . Years of Education:  N/A   Occupational History  . Not on file.   Social History Main Topics  . Smoking status: Former Smoker    Quit date: 10/07/2003  . Smokeless tobacco: Never Used  . Alcohol Use: No  . Drug Use: No  . Sexually Active: Yes   Other Topics Concern  . Not on file   Social History Narrative  . No narrative on file    Review of systems: The patient specifically denies any chest pain at rest or with exertion, dyspnea at rest or with exertion, orthopnea, paroxysmal nocturnal dyspnea, syncope, palpitations, focal neurological deficits, intermittent claudication, lower extremity edema, unexplained weight gain, cough, hemoptysis or wheezing.  The patient also denies abdominal pain, nausea, vomiting, dysphagia, diarrhea, constipation, polyuria, polydipsia, dysuria, hematuria, frequency, urgency, abnormal bleeding or bruising, fever,  chills, unexpected weight changes, mood swings, change in skin or hair texture, change in voice quality, auditory or visual problems, allergic reactions or rashes, new musculoskeletal complaints other than usual "aches and pains".   PHYSICAL EXAM BP 132/80  Pulse 72  Resp 16  Ht 5\' 1"  (1.549 m)  Wt 123 lb 1.6 oz (55.838 kg)  BMI 23.27 kg/m2  General: Alert, oriented x3, no distress Head: no evidence of trauma, PERRL, EOMI, no exophtalmos or lid lag, no myxedema, no xanthelasma; normal ears, nose and oropharynx Neck: normal jugular venous pulsations and no hepatojugular reflux; brisk carotid pulses without delay and no carotid bruits Chest: clear to auscultation, no signs of consolidation by percussion or palpation, normal fremitus, symmetrical and full respiratory excursions Cardiovascular: normal position and quality of the apical impulse, regular rhythm, normal first and paradoxically split second heart sounds, no murmurs, rubs or gallops Abdomen: no tenderness or distention, no masses by palpation, no abnormal pulsatility or arterial bruits, normal bowel sounds, no hepatosplenomegaly Extremities: no clubbing, cyanosis or edema; 2+ radial, ulnar and brachial pulses bilaterally; 2+ right femoral, posterior tibial and dorsalis pedis pulses; 2+ left femoral, posterior tibial and dorsalis pedis pulses; no subclavian or femoral bruits Neurological: grossly nonfocal   EKG: Sinus rhythm, left bundle branch block, QRS duration 128 ms  Lipid Panel  No results found for this basename: chol, trig, hdl, cholhdl, vldl, ldlcalc    BMET    Component Value Date/Time   NA 139 10/10/2012 0612   K 4.0 10/10/2012 0612   CL 103 10/10/2012 0612   CO2 24 10/10/2012 0612   GLUCOSE 103* 10/10/2012 0612   BUN 17 10/10/2012 0612   CREATININE 0.96 10/10/2012 0612   CALCIUM 9.7 10/10/2012 0612   GFRNONAA 58* 10/10/2012 0612   GFRAA 67* 10/10/2012 0612     ASSESSMENT AND PLAN Nonischemic cardiomyopathy- She  appears to have idiopathic dilated cardiomyopathy. Normal coronary arteries angiographically. She has shown a remarkably positive response to medical therapy with ACE inhibitors and carvedilol in her ejection fraction has increased from 15% to 35-40%. She has been wearing a life vest now for 3 months: the device has not reported any episodes of nonsustained arrhythmia and has not delivered therapy. She has excellent functional status (NYHA class I). I think all of these findings of label her as non-critical risk of sudden arrhythmic death and I have recommended that she could discontinue the life vest. Do not think she currently requires cardiac rhythm device therapy.  She does have a left bundle branch block but only a moderately prolonged QRS duration of around 128 ms. Since her heart failure is so well compensated she does not require CRT therapy  at this time. This may be a necessary option in the future if her cardiomyopathy deteriorates.  The mainstay of therapy will be further gradual titration of heart failure medications. Have asked her to increase her carvedilol to 12.5 mg twice a day. She may express transient worsening for a week or 2. I think it is premature to consider discontinuation of diuretic therapy, at least until we have finished with beta blocker titration.  If her blood pressure tolerates it there is also room to increase her ACE inhibitor at her upcoming appointment.  Chronic combined systolic and diastolic CHF, NYHA class 1 Currently, she appears euvolemic and has functional class I on a very low dose of loop diuretics. This suggests good prognosis  Lung nodule, seen on CT 10/07/12- needs follow up in 6 mos This was incidentally noted on a CT of the chest performed at March. A followup chest CT was recommended in 6 months.  Orders Placed This Encounter  Procedures  . EKG 12-Lead   Meds ordered this encounter  Medications  . carvedilol (COREG) 12.5 MG tablet    Sig: Take 1  tablet (12.5 mg total) by mouth 2 (two) times daily with a meal.    Dispense:  60 tablet    Refill:  11    Garvin Ellena  Thurmon Fair, MD, [facility] [phone] office [phone] pager

## 2013-01-16 NOTE — Telephone Encounter (Signed)
Form from [employer] filled out and faxed 01/15/13

## 2013-01-16 NOTE — Assessment & Plan Note (Signed)
This was incidentally noted on a CT of the chest performed at March. A followup chest CT was recommended in 6 months.

## 2013-03-06 ENCOUNTER — Ambulatory Visit (INDEPENDENT_AMBULATORY_CARE_PROVIDER_SITE_OTHER): Payer: Medicare Other | Admitting: Cardiovascular Disease

## 2013-03-06 ENCOUNTER — Encounter: Payer: Self-pay | Admitting: Cardiovascular Disease

## 2013-03-06 VITALS — BP 134/82 | HR 64 | Ht 61.0 in | Wt 122.5 lb

## 2013-03-06 DIAGNOSIS — Z79899 Other long term (current) drug therapy: Secondary | ICD-10-CM

## 2013-03-06 DIAGNOSIS — I428 Other cardiomyopathies: Secondary | ICD-10-CM

## 2013-03-06 DIAGNOSIS — I509 Heart failure, unspecified: Secondary | ICD-10-CM

## 2013-03-06 DIAGNOSIS — I447 Left bundle-branch block, unspecified: Secondary | ICD-10-CM

## 2013-03-06 DIAGNOSIS — R911 Solitary pulmonary nodule: Secondary | ICD-10-CM

## 2013-03-06 DIAGNOSIS — I5042 Chronic combined systolic (congestive) and diastolic (congestive) heart failure: Secondary | ICD-10-CM

## 2013-03-06 MED ORDER — LISINOPRIL 5 MG PO TABS: 7.5000 mg | ORAL_TABLET | Freq: Two times a day (BID) | ORAL | Status: DC

## 2013-03-06 NOTE — Patient Instructions (Addendum)
Your physician recommends that you weigh, daily, at the same time every day, and in the same amount of clothing. Please record your daily weights on the handout provided and bring it to your next appointment. These calls beginning more than 4-5 pounds over today's weight.  Your physician has recommended you make the following change in your medication: Stop furosemide, stop potassium chloride. Increase lisinopril to 7.5 mg twice daily (one and a half tablets twice daily).  Your physician recommends that you schedule a follow-up appointment in: 6 months.  We will order a repeat CT scan for the lung nodules. Non-Cardiac CT scanning, (CAT scanning), is a noninvasive, special x-ray that produces cross-sectional images of the body using x-rays and a computer. CT scans help physicians diagnose and treat medical conditions. For some CT exams, a contrast material is used to enhance visibility in the area of the body being studied. CT scans provide greater clarity and reveal more details than regular x-ray exams.

## 2013-03-08 NOTE — Assessment & Plan Note (Signed)
Incidentally identified nodules on a CT of her chest 6 months ago. The radiologist interpretation recommended a followup study to exclude malignancy since the nodules appeared on a background of emphysema. Note that she quit smoking roughly 10 years ago. CT of the chest with contrast ordered today.

## 2013-03-08 NOTE — Assessment & Plan Note (Addendum)
She is having remarkable and complete clinical recovery and a better than average improvement in left ventricular systolic function with medical therapy. She does not require defibrillator therapy. She appears to have functional class I. She is euvolemic. I think we can try completely discontinuing her diuretic and potassium supplement. We will continue trying to change up the doses of her heart failure medications and today asked her to increase her lisinopril to 7.5 mg twice a day. She is instructed to continue weighing herself daily and immediately report to [place] weight gain of more than 3 pounds in 24 hours or more than 5 pounds in one week

## 2013-03-08 NOTE — Progress Notes (Signed)
Patient ID: Dominique Hernandez, female   DOB: 1941/05/08, 72 y.o.   MRN: [id number]    Reason for office visit Congestive heart failure followup  Dominique Hernandez is now almost 6 months following her initial presentation with acute systolic heart failure and a left ventricular ejection fraction of 15%. Coronary angiography was normal. She has shown remarkable improvement in left ventricular performance on medical therapy, EF most recently estimated 35-40%. This has been accompanied by complete resolution of any symptoms of heart failure. She is on a very small dose of loop diuretic and has not required any adjustments. She denies syncope or palpitations. She initially wore a life vest but we have discontinued it. At this point she does not appear to require primary prevention defibrillator therapy. Her family is extremely attentive and makes sure that she follows recommendations of sodium restriction and daily weights. In fact they recording of her blood pressure, heart rate and await all of which appear to be following a very favorable pattern.bring a extremely detailed    No Known Allergies  Current Outpatient Prescriptions  Medication Sig Dispense Refill  . aspirin EC 81 MG EC tablet Take 1 tablet (81 mg total) by mouth daily.      . Calcium Carbonate-Vitamin D (CALCIUM + D PO) Take 1 tablet by mouth daily.      . carvedilol (COREG) 12.5 MG tablet Take 1 tablet (12.5 mg total) by mouth 2 (two) times daily with a meal.  60 tablet  11  . lisinopril (PRINIVIL,ZESTRIL) 5 MG tablet Take 1.5 tablets (7.5 mg total) by mouth 2 (two) times daily.  90 tablet  11  . Omega-3 Fatty Acids (FISH OIL PO) Take 1 capsule by mouth daily.       No current facility-administered medications for this visit.    Past Medical History  Diagnosis Date  . Nonischemic cardiomyopathy   . LBBB (left bundle branch block)   . Lung nodule     RLL    Past Surgical History  Procedure Laterality Date  . Abdominal  hysterectomy    . Breast surgery      breast implants  . Bladder surgery    . Right & left heart cath  10/08/2012    20% LAD,catheter induced spasm RCA,EF 15-20%    Family History  Problem Relation Age of Onset  . CAD Mother   . Other Father 52    rocky mountain spotted fever  . CAD Sister   . CAD Brother   . CAD Sister   . CAD Sister   . CAD Sister     History   Social History  . Marital Status: Married    Spouse Name: N/A    Number of Children: N/A  . Years of Education: N/A   Occupational History  . Not on file.   Social History Main Topics  . Smoking status: Former Smoker    Quit date: 10/07/2003  . Smokeless tobacco: Never Used  . Alcohol Use: No  . Drug Use: No  . Sexually Active: Yes   Other Topics Concern  . Not on file   Social History Narrative  . No narrative on file    Review of systems: The patient specifically denies any chest pain at rest or with exertion, dyspnea at rest or with exertion, orthopnea, paroxysmal nocturnal dyspnea, syncope, palpitations, focal neurological deficits, intermittent claudication, lower extremity edema, unexplained weight gain, cough, hemoptysis or wheezing.  The patient also denies abdominal pain, nausea, vomiting, dysphagia,  diarrhea, constipation, polyuria, polydipsia, dysuria, hematuria, frequency, urgency, abnormal bleeding or bruising, fever, chills, unexpected weight changes, mood swings, change in skin or hair texture, change in voice quality, auditory or visual problems, allergic reactions or rashes, new musculoskeletal complaints other than usual "aches and pains".   PHYSICAL EXAM BP 134/82  Pulse 64  Ht 5\' 1"  (1.549 m)  Wt 122 lb 8 oz (55.566 kg)  BMI 23.16 kg/m2  General: Alert, oriented x3, no distress Head: no evidence of trauma, PERRL, EOMI, no exophtalmos or lid lag, no myxedema, no xanthelasma; normal ears, nose and oropharynx Neck: normal jugular venous pulsations and no hepatojugular reflux; brisk  carotid pulses without delay and no carotid bruits Chest: clear to auscultation, no signs of consolidation by percussion or palpation, normal fremitus, symmetrical and full respiratory excursions Cardiovascular: normal position and quality of the apical impulse, regular rhythm, normal first and paradoxically split second heart sounds, no murmurs, rubs or gallops Abdomen: no tenderness or distention, no masses by palpation, no abnormal pulsatility or arterial bruits, normal bowel sounds, no hepatosplenomegaly Extremities: no clubbing, cyanosis or edema; 2+ radial, ulnar and brachial pulses bilaterally; 2+ right femoral, posterior tibial and dorsalis pedis pulses; 2+ left femoral, posterior tibial and dorsalis pedis pulses; no subclavian or femoral bruits Neurological: grossly nonfocal   EKG:  sinus rhythm, left bundle branch block  Lipid Panel  No results found for this basename: chol, trig, hdl, cholhdl, vldl, ldlcalc    BMET    Component Value Date/Time   NA 139 10/10/2012 0612   K 4.0 10/10/2012 0612   CL 103 10/10/2012 0612   CO2 24 10/10/2012 0612   GLUCOSE 103* 10/10/2012 0612   BUN 17 10/10/2012 0612   CREATININE 0.96 10/10/2012 0612   CALCIUM 9.7 10/10/2012 0612   GFRNONAA 58* 10/10/2012 0612   GFRAA 67* 10/10/2012 0612     ASSESSMENT AND PLAN Chronic combined systolic and diastolic CHF, NYHA class 1 She is having remarkable and complete clinical recovery and a better than average improvement in left ventricular systolic function with medical therapy. She does not require defibrillator therapy. She appears to have functional class I. She is euvolemic. I think we can try completely discontinuing her diuretic and potassium supplement. We will continue trying to change up the doses of her heart failure medications and today asked her to increase her lisinopril to 7.5 mg twice a day. She is instructed to continue weighing herself daily and immediately report to [place] weight gain of more than 3  pounds in 24 hours or more than 5 pounds in one week  LBBB (left bundle branch block) If she should have worsening clinical heart failure and deterioration in left ventricular ejection fraction, she is a good candidate for cardiac resynchronization therapy with a biventricular pacemaker. She has most of the features that suggest good CRT response including female gender, nonischemic cardio myopathy and left bundle branch block morphology (albeit with a QRS duration of only about 135 ms).  Nonischemic cardiomyopathy- Etiology uncertain, presumed idiopathic  Lung nodule, seen on CT 10/07/12- needs follow up in 6 mos Incidentally identified nodules on a CT of her chest 6 months ago. The radiologist interpretation recommended a followup study to exclude malignancy since the nodules appeared on a background of emphysema. Note that she quit smoking roughly 10 years ago. CT of the chest with contrast ordered today.   Orders Placed This Encounter  Procedures  . CT Chest W Contrast  . Basic Metabolic Panel (BMET)  Meds ordered this encounter  Medications  . lisinopril (PRINIVIL,ZESTRIL) 5 MG tablet    Sig: Take 1.5 tablets (7.5 mg total) by mouth 2 (two) times daily.    Dispense:  90 tablet    Refill:  11    Mekel Haverstock  Thurmon Fair, MD, [facility] [phone] office [phone] pager

## 2013-03-08 NOTE — Assessment & Plan Note (Signed)
If she should have worsening clinical heart failure and deterioration in left ventricular ejection fraction, she is a good candidate for cardiac resynchronization therapy with a biventricular pacemaker. She has most of the features that suggest good CRT response including female gender, nonischemic cardio myopathy and left bundle branch block morphology (albeit with a QRS duration of only about 135 ms).

## 2013-03-08 NOTE — Assessment & Plan Note (Signed)
Etiology uncertain, presumed idiopathic

## 2013-03-13 ENCOUNTER — Telehealth: Payer: Self-pay | Admitting: *Deleted

## 2013-03-13 ENCOUNTER — Telehealth: Payer: Self-pay | Admitting: Cardiovascular Disease

## 2013-03-13 NOTE — Telephone Encounter (Signed)
Labs received.  Dr. Royann Shivers notified and advised: discontinue Ca++ and recheck in 2-3 months.  Call to pt's husband and informed.  Verbalized understanding.

## 2013-03-13 NOTE — Telephone Encounter (Signed)
Returned call and spoke w/ Dominique Hernandez, pt's husband.  Stated pt saw PCP yesterday and everything was good except her CA++ was high.  Wanted to know if pt should keep taking CA++ supplements.  Advised he contact PCP as no lab results for Dr. Salena Saner to review to make a decision and they have the values and can advise.  Verbalized understanding and agreed w/ plan.

## 2013-03-13 NOTE — Telephone Encounter (Signed)
Please call-she got her lab results back and her Calcium was high-Wants to know if she needs to decrease her calcium medicine?

## 2013-03-14 ENCOUNTER — Ambulatory Visit
Admission: RE | Admit: 2013-03-14 | Discharge: 2013-03-14 | Disposition: A | Discharge status: Home / Self Care | Payer: Medicare Other | Source: Ambulatory Visit | Attending: Cardiovascular Disease | Admitting: Cardiovascular Disease

## 2013-03-14 DIAGNOSIS — R911 Solitary pulmonary nodule: Secondary | ICD-10-CM

## 2013-03-14 MED ORDER — IOHEXOL 300 MG/ML  SOLN
75.0000 mL | Freq: Once | INTRAMUSCULAR | Status: AC | PRN
  Administered 2013-03-14: 75 mL via INTRAVENOUS

## 2013-07-17 ENCOUNTER — Telehealth: Payer: Self-pay | Admitting: *Deleted

## 2013-07-17 MED ORDER — LISINOPRIL 10 MG PO TABS: 10.0000 mg | ORAL_TABLET | Freq: Two times a day (BID) | ORAL | Status: DC

## 2013-07-17 NOTE — Telephone Encounter (Signed)
Lisinopril changed from 5mg  1 1/2 tablets bid  To 10mg  bid  - insurance will not cover more than 2 tablets a day.

## 2013-07-21 ENCOUNTER — Telehealth: Payer: Self-pay | Admitting: Cardiovascular Disease

## 2013-07-21 NOTE — Telephone Encounter (Signed)
Returned call and pt verified x 2 w/ Kenyon Ana, pt's husband.  Husband asked if someone called pt last week b/c she will forget if she doesn't write it down.  Thinks someone called about pt's meds.  Reviewed chart and husband informed Hydeia, CMA may have called pt r/t change in lisinopril.  Informed per her note, pt's insurance will not pay for more than 2 pills/day and it was changed to 10 mg twice daily.  Husband verbalized understanding and stated he will call pt now to make sure she is taking the right dose.  Husband also agreed to call back a few days before pt runs out of med so new Rx can be sent to pharmacy since he stated she just picked up a refill a few days before the call last week.

## 2013-07-21 NOTE — Telephone Encounter (Signed)
Per answering service-He need you to call her about her change in medicine, He said you called last week,but pt is confused about what she was told.

## 2013-08-11 ENCOUNTER — Telehealth: Payer: Self-pay | Admitting: Cardiovascular Disease

## 2013-08-11 MED ORDER — LISINOPRIL 10 MG PO TABS: 10.0000 mg | ORAL_TABLET | Freq: Two times a day (BID) | ORAL | Status: DC

## 2013-08-11 NOTE — Telephone Encounter (Signed)
Please call-have some question about her medicine-he could not think of the name of the medicine.He said all he knew it started with a L.

## 2013-08-11 NOTE — Telephone Encounter (Signed)
Returned call and pt verified x 2 w/ pt's husband, Synetta Shadow.  Stated pt needs refill on lisinopril b/c it was increased the last time he called.  Informed Refill(s) sent to pharmacy.  Verbalized understanding.  Husband also wanted pt to have an MRI or something to check out her stomach and lungs and everything.  Stated she has been having stomach problems and breathing problems.  Stated pt has "a touch of emphysema" and it may be r/t that.  Husband advised to contact PCP to schedule an appt for evaluation/physical exam and the appropriate tests should be ordered.  Verbalized understanding and agreed w/ plan.  RN also scheduled pt's Feb appt: Friday, 2.6.15 at 8:15am w/ Dr. Sallyanne Kuster.

## 2013-08-22 ENCOUNTER — Telehealth: Payer: Self-pay | Admitting: Pharmacist Clinician (PhC)/ Clinical Pharmacy Specialist

## 2013-08-22 MED ORDER — ENALAPRIL MALEATE 20 MG PO TABS: 20.0000 mg | ORAL_TABLET | Freq: Every day | ORAL | Status: DC

## 2013-08-22 NOTE — Telephone Encounter (Signed)
Received letter from [facility], not covering lisinopril any longer.  Suspect this is due to pt previously taking 1.5 tabs bid, probably was a quantity limit.  However have had multiple calls with problems refilling over past month, so will just switch patient to benazepril 20mg  once daily (last recorded dose of lisinopril was 10mg  bid).  Spoke with husband, explained to start 12 hours after last lisinopril dose, and just give qd.  Husband voiced understanding.

## 2013-09-05 ENCOUNTER — Ambulatory Visit: Payer: Medicare Other | Admitting: Cardiovascular Disease

## 2013-09-25 ENCOUNTER — Ambulatory Visit: Payer: Medicare Other | Admitting: Cardiovascular Disease

## 2013-10-03 ENCOUNTER — Ambulatory Visit (INDEPENDENT_AMBULATORY_CARE_PROVIDER_SITE_OTHER): Payer: Medicare Other | Admitting: Cardiovascular Disease

## 2013-10-03 ENCOUNTER — Encounter: Payer: Self-pay | Admitting: Cardiovascular Disease

## 2013-10-03 VITALS — BP 134/74 | HR 58 | Resp 16 | Ht 62.0 in | Wt 125.3 lb

## 2013-10-03 DIAGNOSIS — I509 Heart failure, unspecified: Secondary | ICD-10-CM

## 2013-10-03 DIAGNOSIS — E785 Hyperlipidemia, unspecified: Secondary | ICD-10-CM

## 2013-10-03 DIAGNOSIS — I5042 Chronic combined systolic (congestive) and diastolic (congestive) heart failure: Secondary | ICD-10-CM

## 2013-10-03 DIAGNOSIS — I447 Left bundle-branch block, unspecified: Secondary | ICD-10-CM

## 2013-10-03 DIAGNOSIS — E7800 Pure hypercholesterolemia, unspecified: Secondary | ICD-10-CM | POA: Insufficient documentation

## 2013-10-03 NOTE — Patient Instructions (Signed)
Your physician recommends that you schedule a follow-up appointment in: 6 MONTHS with Dr.Croitoru   

## 2013-10-03 NOTE — Assessment & Plan Note (Signed)
Remains completely asymptomatic and euvolemic despite no diuretic therapy. Continue ACEi and carvedilol.Daily weights/sodium restriction.

## 2013-10-03 NOTE — Assessment & Plan Note (Addendum)
If she develops exertional dyspnea or other signs of progression of congestive heart failure she might benefit from cardiac resynchronization therapy. At this point this does not appear to be indicated

## 2013-10-03 NOTE — Progress Notes (Signed)
Patient ID: Dominique Hernandez, female   DOB: 04-22-1941, 73 y.o.   MRN: [id number]      Reason for office visit Nonischemic cardiomyopathy, congestive heart failure  Brandolyn feels great. She denies any exertional dyspnea. She can "run circles around her husband". She has not had problems with edema. She denies dizziness, palpitations, syncope or focal neurological events. Her memory has deteriorated a little bit and she was recently started on Aricept. Dr. Lisbeth Ply also started treatment with low-dose pravastatin for hyperlipidemia.   No Known Allergies  Current Outpatient Prescriptions  Medication Sig Dispense Refill  . aspirin EC 81 MG EC tablet Take 1 tablet (81 mg total) by mouth daily.      . carvedilol (COREG) 12.5 MG tablet Take 1 tablet (12.5 mg total) by mouth 2 (two) times daily with a meal.  60 tablet  11  . enalapril (VASOTEC) 20 MG tablet Take 1 tablet (20 mg total) by mouth daily.  30 tablet  6  . Omega-3 Fatty Acids (FISH OIL PO) Take 1 capsule by mouth daily.      Marland Kitchen donepezil (ARICEPT) 10 MG tablet Take 10 mg by mouth daily.      . pravastatin (PRAVACHOL) 20 MG tablet Take 20 mg by mouth daily.       No current facility-administered medications for this visit.    Past Medical History  Diagnosis Date  . Nonischemic cardiomyopathy   . LBBB (left bundle branch block)   . Lung nodule     RLL    Past Surgical History  Procedure Laterality Date  . Abdominal hysterectomy    . Breast surgery      breast implants  . Bladder surgery    . Right & left heart cath  10/08/2012    20% LAD,catheter induced spasm RCA,EF 15-20%    Family History  Problem Relation Age of Onset  . CAD Mother   . Other Father 32    rocky mountain spotted fever  . CAD Sister   . CAD Brother   . CAD Sister   . CAD Sister   . CAD Sister     History   Social History  . Marital Status: Married    Spouse Name: N/A    Number of Children: N/A  . Years of Education: N/A   Occupational  History  . Not on file.   Social History Main Topics  . Smoking status: Former Smoker    Quit date: 10/07/2003  . Smokeless tobacco: Never Used  . Alcohol Use: No  . Drug Use: No  . Sexual Activity: Yes   Other Topics Concern  . Not on file   Social History Narrative  . No narrative on file    Review of systems: The patient specifically denies any chest pain at rest or with exertion, dyspnea at rest or with exertion, orthopnea, paroxysmal nocturnal dyspnea, syncope, palpitations, focal neurological deficits, intermittent claudication, lower extremity edema, unexplained weight gain, cough, hemoptysis or wheezing.  The patient also denies abdominal pain, nausea, vomiting, dysphagia, diarrhea, constipation, polyuria, polydipsia, dysuria, hematuria, frequency, urgency, abnormal bleeding or bruising, fever, chills, unexpected weight changes, mood swings, change in skin or hair texture, change in voice quality, auditory or visual problems, allergic reactions or rashes, new musculoskeletal complaints other than usual "aches and pains".   PHYSICAL EXAM BP 134/74  Pulse 58  Resp 16  Ht 5\' 2"  (1.575 m)  Wt 56.836 kg (125 lb 4.8 oz)  BMI 22.91 kg/m2  General:  Alert, oriented x3, no distress Head: no evidence of trauma, PERRL, EOMI, no exophtalmos or lid lag, no myxedema, no xanthelasma; normal ears, nose and oropharynx Neck: normal jugular venous pulsations and no hepatojugular reflux; brisk carotid pulses without delay and no carotid bruits Chest: clear to auscultation, no signs of consolidation by percussion or palpation, normal fremitus, symmetrical and full respiratory excursions Cardiovascular: normal position and quality of the apical impulse, regular rhythm, normal first and paradoxically split second heart sounds, no murmurs, rubs or gallops Abdomen: no tenderness or distention, no masses by palpation, no abnormal pulsatility or arterial bruits, normal bowel sounds, no  hepatosplenomegaly Extremities: no clubbing, cyanosis or edema; 2+ radial, ulnar and brachial pulses bilaterally; 2+ right femoral, posterior tibial and dorsalis pedis pulses; 2+ left femoral, posterior tibial and dorsalis pedis pulses; no subclavian or femoral bruits Neurological: grossly nonfocal   EKG: Normal sinus rhythm, left bundle branch block. PR interval was borderline short at 118 ms, but, despite LBBB, the initial QRS deflection is fairly narrow, not consistent with preexcitation  BMET    Component Value Date/Time   NA 139 10/10/2012 0612   K 4.0 10/10/2012 0612   CL 103 10/10/2012 0612   CO2 24 10/10/2012 0612   GLUCOSE 103* 10/10/2012 0612   BUN 17 10/10/2012 0612   CREATININE 0.96 10/10/2012 0612   CALCIUM 9.7 10/10/2012 0612   GFRNONAA 58* 10/10/2012 0612   GFRAA 67* 10/10/2012 0612     ASSESSMENT AND PLAN Chronic combined systolic and diastolic CHF, NYHA class 1 Remains completely asymptomatic and euvolemic despite no diuretic therapy. Continue ACEi and carvedilol.Daily weights/sodium restriction.  LBBB (left bundle branch block) If she develops exertional dyspnea or other signs of progression of congestive heart failure she might benefit from cardiac resynchronization therapy. At this point this does not appear to be indicated   Orders Placed This Encounter  Procedures  . EKG 12-Lead   Meds ordered this encounter  Medications  . donepezil (ARICEPT) 10 MG tablet    Sig: Take 10 mg by mouth daily.  . pravastatin (PRAVACHOL) 20 MG tablet    Sig: Take 20 mg by mouth daily.    [name], MD, [place] [phone] office [phone] pager

## 2013-10-15 ENCOUNTER — Other Ambulatory Visit: Payer: Self-pay | Admitting: *Deleted

## 2014-01-22 ENCOUNTER — Other Ambulatory Visit: Payer: Self-pay | Admitting: *Deleted

## 2014-01-22 MED ORDER — CARVEDILOL 12.5 MG PO TABS: 12.5000 mg | ORAL_TABLET | Freq: Two times a day (BID) | ORAL | Status: DC

## 2014-03-05 ENCOUNTER — Other Ambulatory Visit: Payer: Self-pay | Admitting: Cardiovascular Disease

## 2014-03-05 NOTE — Telephone Encounter (Signed)
Rx was sent to pharmacy electronically. 

## 2014-03-26 ENCOUNTER — Telehealth: Payer: Self-pay | Admitting: Cardiovascular Disease

## 2014-03-26 NOTE — Telephone Encounter (Signed)
Dominique Hernandez is calling because he has some questions about his wife medications .Marland Kitchen Please call    Thanks

## 2014-03-26 NOTE — Telephone Encounter (Signed)
Returned call to patient's husband no answer.LMTC. 

## 2014-04-08 ENCOUNTER — Telehealth: Payer: Self-pay | Admitting: Cardiovascular Disease

## 2014-04-08 NOTE — Telephone Encounter (Signed)
Per wife she is doing just fine.  States her husband is trying to be very protective of her.  Only complaint is occas. Cramp of ring finger.  Told if she is concerning about potassium level eat bananas and drink OJ.  Voiced understanding.

## 2014-04-08 NOTE — Telephone Encounter (Signed)
Returned call to patient's husband not at home.Wife stated she is fine does not know why husband called.Stated he prepares her medications and she does not need any refills.Stated she will have him call back if needed.

## 2014-04-08 NOTE — Telephone Encounter (Signed)
Returning a call,she does not know who called. °

## 2014-04-09 ENCOUNTER — Ambulatory Visit: Payer: Medicare Other | Admitting: Cardiovascular Disease

## 2014-05-29 ENCOUNTER — Encounter: Payer: Self-pay | Admitting: Cardiovascular Disease

## 2014-05-29 ENCOUNTER — Ambulatory Visit (INDEPENDENT_AMBULATORY_CARE_PROVIDER_SITE_OTHER): Payer: Medicare Other | Admitting: Cardiovascular Disease

## 2014-05-29 VITALS — BP 142/70 | HR 61 | Resp 16 | Ht 62.0 in | Wt 125.2 lb

## 2014-05-29 DIAGNOSIS — I428 Other cardiomyopathies: Secondary | ICD-10-CM

## 2014-05-29 DIAGNOSIS — I447 Left bundle-branch block, unspecified: Secondary | ICD-10-CM

## 2014-05-29 DIAGNOSIS — I5042 Chronic combined systolic (congestive) and diastolic (congestive) heart failure: Secondary | ICD-10-CM

## 2014-05-29 DIAGNOSIS — I429 Cardiomyopathy, unspecified: Secondary | ICD-10-CM

## 2014-05-29 NOTE — Progress Notes (Signed)
Patient ID: Dominique Hernandez, female   DOB: May 16, 1941, 73 y.o.   MRN: [id number]      Reason for office visit Nonischemic cardiomyopathy, chronic combined systolic and diastolic heart failure, left bundle branch block  Dominique Hernandez is a 73 year old woman with well cooperative. Most recently her left ventricular ejection fraction was estimated at 35-40 percent. She had normal coronary arteries by angiography last year. She had substantial improvement in left ventricular systolic function after initiation of treatment with beta blockers and enalapril which are now on maximum tolerated doses. She denies any cardiac symptoms. As before, her husband is keeping an extremely detailed record of her blood pressure, heart rate and daily weights. Her blood pressures consistently in the 120s/70s and her weight is very steady at around 124-126 pounds.   No Known Allergies  Current Outpatient Prescriptions  Medication Sig Dispense Refill  . aspirin EC 81 MG EC tablet Take 1 tablet (81 mg total) by mouth daily.      . carvedilol (COREG) 12.5 MG tablet Take 1 tablet (12.5 mg total) by mouth 2 (two) times daily with a meal.  60 tablet  5  . donepezil (ARICEPT) 10 MG tablet Take 10 mg by mouth daily.      . enalapril (VASOTEC) 20 MG tablet TAKE 1 TABLET BY MOUTH DAILY.  30 tablet  7  . Omega-3 Fatty Acids (FISH OIL PO) Take 1 capsule by mouth daily.      . pravastatin (PRAVACHOL) 20 MG tablet Take 20 mg by mouth daily.       No current facility-administered medications for this visit.    Past Medical History  Diagnosis Date  . Nonischemic cardiomyopathy   . LBBB (left bundle branch block)   . Lung nodule     RLL    Past Surgical History  Procedure Laterality Date  . Abdominal hysterectomy    . Breast surgery      breast implants  . Bladder surgery    . Right & left heart cath  10/08/2012    20% LAD,catheter induced spasm RCA,EF 15-20%    Family History  Problem Relation Age of Onset  .  CAD Mother   . Other Father 6    rocky mountain spotted fever  . CAD Sister   . CAD Brother   . CAD Sister   . CAD Sister   . CAD Sister     History   Social History  . Marital Status: Married    Spouse Name: N/A    Number of Children: N/A  . Years of Education: N/A   Occupational History  . Not on file.   Social History Main Topics  . Smoking status: Former Smoker    Quit date: 10/07/2003  . Smokeless tobacco: Never Used  . Alcohol Use: No  . Drug Use: No  . Sexual Activity: Yes   Other Topics Concern  . Not on file   Social History Narrative  . No narrative on file    Review of systems: The patient specifically denies any chest pain at rest or with exertion, dyspnea at rest or with exertion, orthopnea, paroxysmal nocturnal dyspnea, syncope, palpitations, focal neurological deficits, intermittent claudication, lower extremity edema, unexplained weight gain, cough, hemoptysis or wheezing.  The patient also denies abdominal pain, nausea, vomiting, dysphagia, diarrhea, constipation, polyuria, polydipsia, dysuria, hematuria, frequency, urgency, abnormal bleeding or bruising, fever, chills, unexpected weight changes, mood swings, change in skin or hair texture, change in voice quality, auditory or visual  problems, allergic reactions or rashes, new musculoskeletal complaints other than usual "aches and pains".   PHYSICAL EXAM BP 142/70  Pulse 61  Resp 16  Ht 5\' 2"  (1.575 m)  Wt 125 lb 3.2 oz (56.79 kg)  BMI 22.89 kg/m2  General: Alert, oriented x3, no distress Head: no evidence of trauma, PERRL, EOMI, no exophtalmos or lid lag, no myxedema, no xanthelasma; normal ears, nose and oropharynx Neck: normal jugular venous pulsations and no hepatojugular reflux; brisk carotid pulses without delay and no carotid bruits Chest: clear to auscultation, no signs of consolidation by percussion or palpation, normal fremitus, symmetrical and full respiratory  excursions Cardiovascular: normal position and quality of the apical impulse, regular rhythm, normal first and paradoxically split second heart sounds, no murmurs, rubs or gallops Abdomen: no tenderness or distention, no masses by palpation, no abnormal pulsatility or arterial bruits, normal bowel sounds, no hepatosplenomegaly Extremities: no clubbing, cyanosis or edema; 2+ radial, ulnar and brachial pulses bilaterally; 2+ right femoral, posterior tibial and dorsalis pedis pulses; 2+ left femoral, posterior tibial and dorsalis pedis pulses; no subclavian or femoral bruits Neurological: grossly nonfocal   EKG: Normal sinus rhythm, left bundle branch block, QRS 126 ms  Lipid Panel  No results found for this basename: chol, trig, hdl, cholhdl, vldl, ldlcalc, ldldirect    BMET    Component Value Date/Time   NA 139 10/10/2012 0612   K 4.0 10/10/2012 0612   CL 103 10/10/2012 0612   CO2 24 10/10/2012 0612   GLUCOSE 103* 10/10/2012 0612   BUN 17 10/10/2012 0612   CREATININE 0.96 10/10/2012 0612   CALCIUM 9.7 10/10/2012 0612   GFRNONAA 58* 10/10/2012 0612   GFRAA 67* 10/10/2012 0612     ASSESSMENT AND PLAN  Dominique Hernandez has well compensated chronic heart failure with depressed left ventricular systolic function on appropriate treatment with ace inhibitors and beta blockers, not requiring loop diuretic therapy, clinically at euvolemic, NYHA functional class I.  She also has a left bundle branch block, albeit not a very broad one. If her cardiomyopathy should progress and she again develops heart failure, there is an option to consider CRT.  Orders Placed This Encounter  Procedures  . EKG 12-Lead   No orders of the defined types were placed in this encounter.    [name], MD, [place] [phone] office [phone] pager

## 2014-05-29 NOTE — Patient Instructions (Signed)
Dr. Croitoru recommends that you schedule a follow-up appointment in: One year.   

## 2014-07-09 ENCOUNTER — Encounter (HOSPITAL_COMMUNITY): Payer: Self-pay | Admitting: Cardiovascular Disease

## 2014-07-15 ENCOUNTER — Other Ambulatory Visit: Payer: Self-pay | Admitting: Cardiovascular Disease

## 2014-09-11 DIAGNOSIS — Z01419 Encounter for gynecological examination (general) (routine) without abnormal findings: Secondary | ICD-10-CM | POA: Diagnosis not present

## 2014-09-11 DIAGNOSIS — Z0000 Encounter for general adult medical examination without abnormal findings: Secondary | ICD-10-CM | POA: Diagnosis not present

## 2014-09-30 DIAGNOSIS — Z1389 Encounter for screening for other disorder: Secondary | ICD-10-CM | POA: Diagnosis not present

## 2014-09-30 DIAGNOSIS — Z79899 Other long term (current) drug therapy: Secondary | ICD-10-CM | POA: Diagnosis not present

## 2014-09-30 DIAGNOSIS — Z9181 History of falling: Secondary | ICD-10-CM | POA: Diagnosis not present

## 2014-09-30 DIAGNOSIS — G3184 Mild cognitive impairment, so stated: Secondary | ICD-10-CM | POA: Diagnosis not present

## 2014-09-30 DIAGNOSIS — E780 Pure hypercholesterolemia: Secondary | ICD-10-CM | POA: Diagnosis not present

## 2014-10-06 ENCOUNTER — Other Ambulatory Visit: Payer: Self-pay | Admitting: *Deleted

## 2014-10-06 MED ORDER — CARVEDILOL 12.5 MG PO TABS: ORAL_TABLET | ORAL | Status: DC

## 2014-10-06 MED ORDER — ENALAPRIL MALEATE 20 MG PO TABS: 20.0000 mg | ORAL_TABLET | Freq: Every day | ORAL | Status: DC

## 2014-10-06 NOTE — Telephone Encounter (Signed)
Rx(s) sent to pharmacy electronically.  

## 2015-02-07 DIAGNOSIS — I447 Left bundle-branch block, unspecified: Secondary | ICD-10-CM | POA: Diagnosis not present

## 2015-02-07 DIAGNOSIS — Z7982 Long term (current) use of aspirin: Secondary | ICD-10-CM | POA: Diagnosis not present

## 2015-02-07 DIAGNOSIS — E780 Pure hypercholesterolemia: Secondary | ICD-10-CM | POA: Diagnosis not present

## 2015-02-07 DIAGNOSIS — I509 Heart failure, unspecified: Secondary | ICD-10-CM | POA: Diagnosis not present

## 2015-02-07 DIAGNOSIS — Z87891 Personal history of nicotine dependence: Secondary | ICD-10-CM | POA: Diagnosis not present

## 2015-02-07 DIAGNOSIS — I10 Essential (primary) hypertension: Secondary | ICD-10-CM | POA: Diagnosis not present

## 2015-02-07 DIAGNOSIS — Z79899 Other long term (current) drug therapy: Secondary | ICD-10-CM | POA: Diagnosis not present

## 2015-02-07 DIAGNOSIS — S61412A Laceration without foreign body of left hand, initial encounter: Secondary | ICD-10-CM | POA: Diagnosis not present

## 2015-02-07 DIAGNOSIS — I252 Old myocardial infarction: Secondary | ICD-10-CM | POA: Diagnosis not present

## 2015-04-06 DIAGNOSIS — Z9181 History of falling: Secondary | ICD-10-CM | POA: Diagnosis not present

## 2015-04-06 DIAGNOSIS — E780 Pure hypercholesterolemia: Secondary | ICD-10-CM | POA: Diagnosis not present

## 2015-04-06 DIAGNOSIS — Z79899 Other long term (current) drug therapy: Secondary | ICD-10-CM | POA: Diagnosis not present

## 2015-04-06 DIAGNOSIS — Z23 Encounter for immunization: Secondary | ICD-10-CM | POA: Diagnosis not present

## 2015-04-06 DIAGNOSIS — Z1389 Encounter for screening for other disorder: Secondary | ICD-10-CM | POA: Diagnosis not present

## 2015-04-06 DIAGNOSIS — Z139 Encounter for screening, unspecified: Secondary | ICD-10-CM | POA: Diagnosis not present

## 2015-04-06 DIAGNOSIS — G3184 Mild cognitive impairment, so stated: Secondary | ICD-10-CM | POA: Diagnosis not present

## 2015-04-27 DIAGNOSIS — Z1231 Encounter for screening mammogram for malignant neoplasm of breast: Secondary | ICD-10-CM | POA: Diagnosis not present

## 2015-04-27 DIAGNOSIS — M899 Disorder of bone, unspecified: Secondary | ICD-10-CM | POA: Diagnosis not present

## 2015-04-27 DIAGNOSIS — M810 Age-related osteoporosis without current pathological fracture: Secondary | ICD-10-CM | POA: Diagnosis not present

## 2015-05-19 DIAGNOSIS — L821 Other seborrheic keratosis: Secondary | ICD-10-CM | POA: Diagnosis not present

## 2015-05-19 DIAGNOSIS — D225 Melanocytic nevi of trunk: Secondary | ICD-10-CM | POA: Diagnosis not present

## 2015-05-19 DIAGNOSIS — D1801 Hemangioma of skin and subcutaneous tissue: Secondary | ICD-10-CM | POA: Diagnosis not present

## 2015-05-19 DIAGNOSIS — C44729 Squamous cell carcinoma of skin of left lower limb, including hip: Secondary | ICD-10-CM | POA: Diagnosis not present

## 2015-06-07 ENCOUNTER — Ambulatory Visit: Payer: Self-pay | Admitting: Cardiovascular Disease

## 2015-06-11 ENCOUNTER — Ambulatory Visit (INDEPENDENT_AMBULATORY_CARE_PROVIDER_SITE_OTHER): Payer: Medicare Other | Admitting: Cardiovascular Disease

## 2015-06-11 ENCOUNTER — Encounter: Payer: Self-pay | Admitting: Cardiovascular Disease

## 2015-06-11 DIAGNOSIS — I428 Other cardiomyopathies: Secondary | ICD-10-CM

## 2015-06-11 DIAGNOSIS — I5042 Chronic combined systolic (congestive) and diastolic (congestive) heart failure: Secondary | ICD-10-CM | POA: Diagnosis not present

## 2015-06-11 DIAGNOSIS — E785 Hyperlipidemia, unspecified: Secondary | ICD-10-CM

## 2015-06-11 DIAGNOSIS — I447 Left bundle-branch block, unspecified: Secondary | ICD-10-CM | POA: Diagnosis not present

## 2015-06-11 DIAGNOSIS — I429 Cardiomyopathy, unspecified: Secondary | ICD-10-CM

## 2015-06-11 NOTE — Patient Instructions (Signed)
Dr. Croitoru recommends that you schedule a follow-up appointment in: ONE YEAR   

## 2015-06-11 NOTE — Progress Notes (Signed)
Patient ID: Dominique Hernandez, female   DOB: 02-Apr-1941, 74 y.o.   MRN: [id number]      Cardiology Office Note   Date:  06/12/2015   ID:  Dominique Hernandez, DOB October 03, 1940, MRN [id number]  PCP:  Dominique Sake, MD  Cardiologist:   Sanda Klein, MD   Chief Complaint  Patient presents with  . Follow-up    no chest pain, no shortness of breath,no edema, no pain in legs, no cramping in legs, no lightheadedness, no dizzinesnos      History of Present Illness: Dominique Hernandez is a 74 y.o. female who presents for  Follow-up of nonischemic cardiomyopathy and left bundle branch block. She is doing very well and repeatedly states that she can "outgrown any of her children". She denies issues with syncope or palpitations. She has chronic functional class I-II exertional dyspnea. She was able to walk all through the [place] and the grounds and only had to stop once when walking towards the parking lot.  She has not had lower extremity edema despite the fact that she does not take any diuretics. Her blood pressure today is quite high, but reportedly at home it is usually in the 120s. They think that the home monitor is accurate (as always she is accompanied by her  Husband and daughter).  At 128 pounds, she is not far off from what we have previously estimated to be her dry weight of 126 pounds   she denies any other cardiovascular complaints.  Past Medical History  Diagnosis Date  . Nonischemic cardiomyopathy ([place])   . LBBB (left bundle branch block)   . Lung nodule     RLL    Past Surgical History  Procedure Laterality Date  . Abdominal hysterectomy    . Breast surgery      breast implants  . Bladder surgery    . Right & left heart cath  10/08/2012    20% LAD,catheter induced spasm RCA,EF 15-20%  . Left and right heart catheterization with coronary angiogram N/A 10/08/2012    Procedure: LEFT AND RIGHT HEART CATHETERIZATION WITH CORONARY ANGIOGRAM;  Surgeon: Troy Sine,  MD;  Location: [facility] C[facility] LAB;  Service: Cardiovascular;  Laterality: N/A;     Current Outpatient Prescriptions  Medication Sig Dispense Refill  . aspirin EC 81 MG EC tablet Take 1 tablet (81 mg total) by mouth daily.    . carvedilol (COREG) 12.5 MG tablet TAKE 1 TABLET BY MOUTH 2 TIMES DAILY WITH A MEAL. 180 tablet 2  . donepezil (ARICEPT) 10 MG tablet Take 10 mg by mouth daily.    . enalapril (VASOTEC) 20 MG tablet Take 1 tablet (20 mg total) by mouth daily. 90 tablet 2  . Omega-3 Fatty Acids (FISH OIL PO) Take 1 capsule by mouth daily.    . pravastatin (PRAVACHOL) 20 MG tablet Take 20 mg by mouth daily.     No current facility-administered medications for this visit.    Allergies:   Review of patient's allergies indicates no known allergies.    Social History:  The patient  reports that she quit smoking about 11 years ago. She has never used smokeless tobacco. She reports that she does not drink alcohol or use illicit drugs.   Family History:  The patient's family history includes CAD in her brother, mother, sister, sister, sister, and sister; Other (age of onset: 62) in her father.    ROS:  Please see the history of present illness.    Otherwise,  review of systems positive for none.   All other systems are reviewed and negative.    PHYSICAL EXAM: VS:  BP 182/90 mmHg  Pulse 59  Ht 5\' 1"  (1.549 m)  Wt 128 lb 4 oz (58.174 kg)  BMI 24.25 kg/m2 , BMI Body mass index is 24.25 kg/(m^2).  General: Alert, oriented x3, no distress Head: no evidence of trauma, PERRL, EOMI, no exophtalmos or lid lag, no myxedema, no xanthelasma; normal ears, nose and oropharynx Neck: normal jugular venous pulsations and no hepatojugular reflux; brisk carotid pulses without delay and no carotid bruits Chest: clear to auscultation, no signs of consolidation by percussion or palpation, normal fremitus, symmetrical and full respiratory excursions Cardiovascular: normal position and quality of the apical  impulse, regular rhythm, normal first and paradoxically split second heart sounds, no murmurs, rubs or gallops Abdomen: no tenderness or distention, no masses by palpation, no abnormal pulsatility or arterial bruits, normal bowel sounds, no hepatosplenomegaly Extremities: no clubbing, cyanosis or edema; 2+ radial, ulnar and brachial pulses bilaterally; 2+ right femoral, posterior tibial and dorsalis pedis pulses; 2+ left femoral, posterior tibial and dorsalis pedis pulses; no subclavian or femoral bruits Neurological: grossly nonfocal Psych: euthymic mood, full affect   EKG:  EKG is ordered today. The ekg ordered today demonstrates  Normal sinus rhythm, left bundle branch block, QRS 128 ms, QTC 429 ms   Recent Labs:  were performed by Dr. Lisbeth Ply but are not currently available for review  Wt Readings from Last 3 Encounters:  06/11/15 128 lb 4 oz (58.174 kg)  05/29/14 125 lb 3.2 oz (56.79 kg)  10/03/13 125 lb 4.8 oz (56.836 kg)      ASSESSMENT AND PLAN:  1.  Chronic combined systolic and diastolic heart failure. Euvolemic, well compensated. Chronic functional class I-II. On maximum tolerated doses of carvedilol and lisinopril. Does not require diuretic therapy.  2.  Left bundle branch block. The option for cardiac resynchronization therapy has been discussed but she prefers noninvasive management  3.  Hyperlipidemia I don't have her most recent labs but she is on statin therapy. We discussed the fact that the pravastatin is a particular weak agent and that the dose she is on is very small. Can't make additional recommendations until I see the labs.    Current medicines are reviewed at length with the patient today.  The patient does not have concerns regarding medicines.  The following changes have been made:  no change  Labs/ tests ordered today include:  No orders of the defined types were placed in this encounter.    Patient Instructions  Dr. Sallyanne Kuster recommends that you  schedule a follow-up appointment in: ONE YEAR       SignedSanda Klein, MD  06/12/2015 12:05 PM    Sanda Klein, MD, [facility] [phone] office [phone] pager

## 2015-06-30 DIAGNOSIS — Z08 Encounter for follow-up examination after completed treatment for malignant neoplasm: Secondary | ICD-10-CM | POA: Diagnosis not present

## 2015-06-30 DIAGNOSIS — Z85828 Personal history of other malignant neoplasm of skin: Secondary | ICD-10-CM | POA: Diagnosis not present

## 2015-07-20 ENCOUNTER — Other Ambulatory Visit: Payer: Self-pay | Admitting: Cardiovascular Disease

## 2015-08-17 ENCOUNTER — Other Ambulatory Visit: Payer: Self-pay | Admitting: Cardiovascular Disease

## 2015-08-17 NOTE — Telephone Encounter (Signed)
Rx request sent to pharmacy.  

## 2015-10-12 DIAGNOSIS — E7800 Pure hypercholesterolemia, unspecified: Secondary | ICD-10-CM | POA: Diagnosis not present

## 2015-10-12 DIAGNOSIS — Z79899 Other long term (current) drug therapy: Secondary | ICD-10-CM | POA: Diagnosis not present

## 2015-10-12 DIAGNOSIS — G3184 Mild cognitive impairment, so stated: Secondary | ICD-10-CM | POA: Diagnosis not present

## 2015-10-12 DIAGNOSIS — I509 Heart failure, unspecified: Secondary | ICD-10-CM | POA: Diagnosis not present

## 2015-10-12 DIAGNOSIS — Z6824 Body mass index (BMI) 24.0-24.9, adult: Secondary | ICD-10-CM | POA: Diagnosis not present

## 2015-12-08 DIAGNOSIS — Z01419 Encounter for gynecological examination (general) (routine) without abnormal findings: Secondary | ICD-10-CM | POA: Diagnosis not present

## 2015-12-08 DIAGNOSIS — Z6824 Body mass index (BMI) 24.0-24.9, adult: Secondary | ICD-10-CM | POA: Diagnosis not present

## 2015-12-08 DIAGNOSIS — Z1389 Encounter for screening for other disorder: Secondary | ICD-10-CM | POA: Diagnosis not present

## 2016-01-11 ENCOUNTER — Other Ambulatory Visit: Payer: Self-pay | Admitting: Cardiovascular Disease

## 2016-01-11 NOTE — Telephone Encounter (Signed)
Rx Refill

## 2016-01-12 DIAGNOSIS — X32XXXA Exposure to sunlight, initial encounter: Secondary | ICD-10-CM | POA: Diagnosis not present

## 2016-01-12 DIAGNOSIS — Z85828 Personal history of other malignant neoplasm of skin: Secondary | ICD-10-CM | POA: Diagnosis not present

## 2016-01-12 DIAGNOSIS — Z1283 Encounter for screening for malignant neoplasm of skin: Secondary | ICD-10-CM | POA: Diagnosis not present

## 2016-01-12 DIAGNOSIS — Z08 Encounter for follow-up examination after completed treatment for malignant neoplasm: Secondary | ICD-10-CM | POA: Diagnosis not present

## 2016-01-12 DIAGNOSIS — L570 Actinic keratosis: Secondary | ICD-10-CM | POA: Diagnosis not present

## 2016-04-21 DIAGNOSIS — Z1389 Encounter for screening for other disorder: Secondary | ICD-10-CM | POA: Diagnosis not present

## 2016-04-21 DIAGNOSIS — Z79899 Other long term (current) drug therapy: Secondary | ICD-10-CM | POA: Diagnosis not present

## 2016-04-21 DIAGNOSIS — Z9181 History of falling: Secondary | ICD-10-CM | POA: Diagnosis not present

## 2016-04-21 DIAGNOSIS — E7800 Pure hypercholesterolemia, unspecified: Secondary | ICD-10-CM | POA: Diagnosis not present

## 2016-04-21 DIAGNOSIS — Z23 Encounter for immunization: Secondary | ICD-10-CM | POA: Diagnosis not present

## 2016-05-16 ENCOUNTER — Other Ambulatory Visit: Payer: Self-pay | Admitting: Cardiovascular Disease

## 2016-05-16 NOTE — Telephone Encounter (Signed)
Rx has been sent to the pharmacy electronically. ° °

## 2016-05-18 DIAGNOSIS — Z1231 Encounter for screening mammogram for malignant neoplasm of breast: Secondary | ICD-10-CM | POA: Diagnosis not present

## 2016-06-28 ENCOUNTER — Ambulatory Visit: Payer: Medicare Other | Admitting: Cardiovascular Disease

## 2016-06-28 ENCOUNTER — Telehealth: Payer: Self-pay | Admitting: Cardiovascular Disease

## 2016-06-28 NOTE — Telephone Encounter (Signed)
Noted and will see if there is anything I can do, but this reqwuest is best addressed to her PCP. MCr

## 2016-06-28 NOTE — Telephone Encounter (Signed)
Spoke with Daughter Sharyn Lull (notified not on DPR list and she will have mother update this at appt tomorrow) she woould like to Dr Croitoru's attention she states that mother has been having short-term memory issues this is nothing new but she would like to know if there is something they could do about this? She states that she will have mother update so Dr C can speak with her about this on the side, she states that mother will become irate if this is discussed in front of her. I explained to her that this might not be possible. Please do not mention to pt that she called today.

## 2016-06-28 NOTE — Telephone Encounter (Signed)
Pt's daughter want to speak to nurse concerning some issures with the pt but didn't want her mom to become agitated with asking the questions in front of the pt. Pt has appt 11.30.17 w/Dr Croitoru

## 2016-06-28 NOTE — Telephone Encounter (Signed)
This caller is not on a DPR list.

## 2016-06-28 NOTE — Telephone Encounter (Signed)
Left detailed message for daughter and informed of Dr cr message

## 2016-06-29 ENCOUNTER — Encounter: Payer: Self-pay | Admitting: Cardiovascular Disease

## 2016-06-29 ENCOUNTER — Ambulatory Visit (INDEPENDENT_AMBULATORY_CARE_PROVIDER_SITE_OTHER): Payer: Medicare Other | Admitting: Cardiovascular Disease

## 2016-06-29 VITALS — BP 180/80 | HR 58 | Ht 61.0 in | Wt 127.6 lb

## 2016-06-29 DIAGNOSIS — E7800 Pure hypercholesterolemia, unspecified: Secondary | ICD-10-CM | POA: Diagnosis not present

## 2016-06-29 DIAGNOSIS — I447 Left bundle-branch block, unspecified: Secondary | ICD-10-CM | POA: Diagnosis not present

## 2016-06-29 DIAGNOSIS — I10 Essential (primary) hypertension: Secondary | ICD-10-CM | POA: Diagnosis not present

## 2016-06-29 DIAGNOSIS — I5022 Chronic systolic (congestive) heart failure: Secondary | ICD-10-CM | POA: Diagnosis not present

## 2016-06-29 MED ORDER — ENALAPRIL MALEATE 20 MG PO TABS: 20.0000 mg | ORAL_TABLET | Freq: Two times a day (BID) | ORAL | 3 refills | Status: DC

## 2016-06-29 NOTE — Patient Instructions (Signed)
Medication Instructions: Dr Sallyanne Kuster has recommended making the following medication changes: 1. INCREASE Enalapril to 20 mg TWICE daily  Labwork: NONE ORDERED  Testing/Procedures: 1. Echocardiogram - Your physician has requested that you have an echocardiogram. Echocardiography is a painless test that uses sound waves to create images of your heart. It provides your doctor with information about the size and shape of your heart and how well your heart's chambers and valves are working. This procedure takes approximately one hour. There are no restrictions for this procedure. This will be performed at our [facility] location - [address], [address].  Follow-up: Dr Sallyanne Kuster recommends that you schedule a follow-up appointment in 1 year. You will receive a reminder letter in the mail two months in advance. If you don't receive a letter, please call our office to schedule the follow-up appointment.  If you need a refill on your cardiac medications before your next appointment, please call your pharmacy.

## 2016-06-29 NOTE — Progress Notes (Signed)
Cardiology Office Note    Date:  06/29/2016   ID:  DELISE HAMIDI, DOB [birth year], MRN [id number]  PCP:  Leonides Sake, MD  Cardiologist:  Sanda Klein, MD   Chief Complaint  Patient presents with  . Follow-up    History of Present Illness:  Dominique Hernandez is a 75 y.o. female with moderate nonischemic cardiomyopathy but very well compensated congestive heart failure, returning for follow-up. When initially diagnosed in early 2014 LVEF was 15%, no significant CAD. After institution of medical therapy, LVEF increased to 35-40%, but has not been reassessed since June 2014. She has long-standing left bundle branch block. Since her last appointment, she has developed worsening memory and now is taking both Aricept and Namenda. As always she remains fiercely independent and contradicts her children when they suggest that she has any physical limitations. Her son does mention the fact that she becomes short of breath if she has to walk uphill. She does most of her own housework without assistance.  The patient specifically denies any chest pain at rest or with exertion, orthopnea, paroxysmal nocturnal dyspnea, syncope, palpitations, focal neurological deficits, intermittent claudication, lower extremity edema, unexplained weight gain, cough, hemoptysis or wheezing. Her weight is unchanged.  Today, her blood pressure is very high 180/80, rechecked 190/90. She claims that at home her blood pressure monitor typically shows 120/70 and reports that she is nervous in the doctor's office.   Past Medical History:  Diagnosis Date  . LBBB (left bundle branch block)   . Lung nodule    RLL  . Nonischemic cardiomyopathy [facility])     Past Surgical History:  Procedure Laterality Date  . ABDOMINAL HYSTERECTOMY    . BLADDER SURGERY    . BREAST SURGERY     breast implants  . LEFT AND RIGHT HEART CATHETERIZATION WITH CORONARY ANGIOGRAM N/A 10/08/2012   Procedure: LEFT AND RIGHT HEART  CATHETERIZATION WITH CORONARY ANGIOGRAM;  Surgeon: Troy Sine, MD;  Location: [facility] CATH LAB;  Service: Cardiovascular;  Laterality: N/A;  . RIGHT & LEFT HEART CATH  10/08/2012   20% LAD,catheter induced spasm RCA,EF 15-20%    Current Medications: Outpatient Medications Prior to Visit  Medication Sig Dispense Refill  . aspirin EC 81 MG EC tablet Take 1 tablet (81 mg total) by mouth daily.    . carvedilol (COREG) 12.5 MG tablet TAKE 1 TABLET BY MOUTH 2 TIMES DAILY WITH A MEAL. 180 tablet 1  . Omega-3 Fatty Acids (FISH OIL PO) Take 1 capsule by mouth daily.    . pravastatin (PRAVACHOL) 20 MG tablet Take 20 mg by mouth daily.    . enalapril (VASOTEC) 20 MG tablet Take 1 tablet (20 mg total) by mouth daily. Keep appointment on November 30th, for more refills 90 tablet 0  . donepezil (ARICEPT) 10 MG tablet Take 10 mg by mouth daily.     No facility-administered medications prior to visit.      Allergies:   Patient has no known allergies.   Social History   Social History  . Marital status: Married    Spouse name: N/A  . Number of children: N/A  . Years of education: N/A   Social History Main Topics  . Smoking status: Former Smoker    Quit date: 10/07/2003  . Smokeless tobacco: Never Used  . Alcohol use No  . Drug use: No  . Sexual activity: Yes   Other Topics Concern  . None   Social History Narrative  . None  Family History:  The patient's family history includes CAD in her brother, mother, sister, sister, sister, and sister; Other (age of onset: 3) in her father.   ROS:   Please see the history of present illness.    ROS All other systems reviewed and are negative.   PHYSICAL EXAM:   VS:  BP (!) 180/80 (BP Location: Right Arm, Patient Position: Sitting, Cuff Size: Normal)   Pulse (!) 58   Ht 5\' 1"  (1.549 m)   Wt 127 lb 9.6 oz (57.9 kg)   SpO2 97%   BMI 24.11 kg/m     She reports that at home her blood pressure is 120/70. When I rechecked her blood pressure  here today was even higher at 190/90. GEN: Well nourished, well developed, in no acute distress  HEENT: normal  Neck: no JVD, carotid bruits, or masses Cardiac: Paradoxically split second heart sound RRR; no murmurs, rubs, or gallops,no edema  Respiratory:  clear to auscultation bilaterally, normal work of breathing GI: soft, nontender, nondistended, + BS MS: no deformity or atrophy  Skin: warm and dry, no rash Neuro:  Alert and Oriented x 3, Strength and sensation are intact Psych: euthymic mood, full affect  Wt Readings from Last 3 Encounters:  06/29/16 127 lb 9.6 oz (57.9 kg)  06/11/15 128 lb 4 oz (58.2 kg)  05/29/14 125 lb 3.2 oz (56.8 kg)      Studies/Labs Reviewed:   EKG:  EKG is ordered today.  The ekg ordered today demonstrates Sinus bradycardia with borderline short PR at about 120 ms, left bundle branch block QRS 126 ms, QTC 453 ms  Recent Labs: Total cholesterol 174, HDL 54, LDL 95, triglycerides 123 Glucose 105, creatinine 0.8, BUN 13, TSH 1.5  ASSESSMENT:    1. Chronic systolic congestive heart failure ([place])   2. Essential hypertension   3. LBBB (left bundle branch block)   4. Pure hypercholesterolemia      PLAN:  In order of problems listed above:  1. Dominique Hernandez to have NYHA functional class II status taking into account her family's report. Does not require diuretics to maintain compensation and does not have any edema. Will increase the enalapril to 20 mg twice daily. Recheck echocardiogram. 2. HTN: Blood pressure was quite high today and we increased the enalapril. Even if this is truly whitecoat hypertension, would like her to be on the highest tolerated dose of ACE inhibitor anyway. The patient and her family will send [place] some recordings of her blood pressure with her home monitor in a few weeks. 3. LBBB: Option for CRT-P if her heart failure symptoms worsen. This does not appear justified at this time. Resynchronization might be a little challenging due  to her short AV conduction time. 4. HLP: All lipid parameters are within the desirable range, even though she is taking a very low dose of a week statin. 5. Dementia: Atrial bradycardia with simultaneous use of carvedilol and cholinesterase inhibitors. Do not plan to further increase carvedilol.   Medication Adjustments/Labs and Tests Ordered: Current medicines are reviewed at length with the patient today.  Concerns regarding medicines are outlined above.  Medication changes, Labs and Tests ordered today are listed in the Patient Instructions below. Patient Instructions  Medication Instructions: Dr Sallyanne Kuster has recommended making the following medication changes: 1. INCREASE Enalapril to 20 mg TWICE daily  Labwork: NONE ORDERED  Testing/Procedures: 1. Echocardiogram - Your physician has requested that you have an echocardiogram. Echocardiography is a painless test that uses sound  waves to create images of your heart. It provides your doctor with information about the size and shape of your heart and how well your heart's chambers and valves are working. This procedure takes approximately one hour. There are no restrictions for this procedure. This will be performed at our [facility] location - [address].  Follow-up: Dr Sallyanne Kuster recommends that you schedule a follow-up appointment in 1 year. You will receive a reminder letter in the mail two months in advance. If you don't receive a letter, please call our office to schedule the follow-up appointment.  If you need a refill on your cardiac medications before your next appointment, please call your pharmacy.    Signed, Sanda Klein, MD  06/29/2016 3:21 PM    [facility] [place], [place], [place]  [address] Phone: [phone]; Fax: [phone]

## 2016-07-03 ENCOUNTER — Telehealth: Payer: Self-pay | Admitting: Cardiovascular Disease

## 2016-07-03 NOTE — Telephone Encounter (Signed)
Spoke with husband states that on 06-29-16 [employer] sent over a nurse and did some sort of testing and this noted that pt's left leg had startling  decreased circulation per husband. Right=0.96 and Left was 0.26. He states that he is away from home abd will be back shortly and will call back when he gets home with what the name of the test was.

## 2016-07-03 NOTE — Telephone Encounter (Signed)
Pt was checked by [facility] nurse on Thursday.She says the pt have very little circulation in her left leg. It read.26. Please call to advise.

## 2016-07-06 ENCOUNTER — Other Ambulatory Visit: Payer: Self-pay | Admitting: Cardiovascular Disease

## 2016-08-11 ENCOUNTER — Encounter (INDEPENDENT_AMBULATORY_CARE_PROVIDER_SITE_OTHER): Payer: Self-pay

## 2016-08-11 ENCOUNTER — Other Ambulatory Visit: Payer: Self-pay

## 2016-08-11 ENCOUNTER — Ambulatory Visit (HOSPITAL_COMMUNITY): Payer: Medicare Other | Attending: Cardiology

## 2016-08-11 DIAGNOSIS — I340 Nonrheumatic mitral (valve) insufficiency: Secondary | ICD-10-CM | POA: Insufficient documentation

## 2016-08-11 DIAGNOSIS — I509 Heart failure, unspecified: Secondary | ICD-10-CM | POA: Diagnosis present

## 2016-08-11 DIAGNOSIS — I110 Hypertensive heart disease with heart failure: Secondary | ICD-10-CM | POA: Diagnosis not present

## 2016-08-11 DIAGNOSIS — I5022 Chronic systolic (congestive) heart failure: Secondary | ICD-10-CM

## 2016-08-11 DIAGNOSIS — I351 Nonrheumatic aortic (valve) insufficiency: Secondary | ICD-10-CM | POA: Diagnosis not present

## 2016-08-11 DIAGNOSIS — I361 Nonrheumatic tricuspid (valve) insufficiency: Secondary | ICD-10-CM | POA: Insufficient documentation

## 2016-08-11 DIAGNOSIS — I428 Other cardiomyopathies: Secondary | ICD-10-CM | POA: Diagnosis not present

## 2016-08-11 DIAGNOSIS — E785 Hyperlipidemia, unspecified: Secondary | ICD-10-CM | POA: Diagnosis not present

## 2016-08-11 DIAGNOSIS — I447 Left bundle-branch block, unspecified: Secondary | ICD-10-CM | POA: Diagnosis not present

## 2016-08-15 ENCOUNTER — Telehealth: Payer: Self-pay | Admitting: Cardiovascular Disease

## 2016-08-15 NOTE — Telephone Encounter (Signed)
Results discussed - see note.

## 2016-08-15 NOTE — Telephone Encounter (Signed)
Mr. Dominique Hernandez is calling to get test results for [name]. Please call

## 2017-06-14 ENCOUNTER — Other Ambulatory Visit: Payer: Self-pay | Admitting: Cardiovascular Disease

## 2017-06-22 ENCOUNTER — Other Ambulatory Visit: Payer: Self-pay | Admitting: Cardiovascular Disease

## 2017-06-25 NOTE — Telephone Encounter (Signed)
Rx(s) sent to pharmacy electronically.  

## 2017-09-11 ENCOUNTER — Other Ambulatory Visit: Payer: Self-pay | Admitting: Cardiovascular Disease

## 2017-09-21 ENCOUNTER — Other Ambulatory Visit: Payer: Self-pay | Admitting: Cardiovascular Disease

## 2017-09-21 ENCOUNTER — Ambulatory Visit: Payer: Medicare Other | Admitting: Cardiovascular Disease

## 2017-09-21 NOTE — Telephone Encounter (Signed)
REFILL 

## 2017-09-28 ENCOUNTER — Encounter: Payer: Self-pay | Admitting: Cardiovascular Disease

## 2017-09-28 ENCOUNTER — Ambulatory Visit: Payer: Medicare Other | Admitting: Cardiovascular Disease

## 2017-09-28 VITALS — BP 132/70 | HR 64 | Ht 61.0 in | Wt 132.0 lb

## 2017-09-28 DIAGNOSIS — E7800 Pure hypercholesterolemia, unspecified: Secondary | ICD-10-CM

## 2017-09-28 DIAGNOSIS — I5042 Chronic combined systolic (congestive) and diastolic (congestive) heart failure: Secondary | ICD-10-CM | POA: Diagnosis not present

## 2017-09-28 DIAGNOSIS — I447 Left bundle-branch block, unspecified: Secondary | ICD-10-CM | POA: Diagnosis not present

## 2017-09-28 DIAGNOSIS — F0390 Unspecified dementia without behavioral disturbance: Secondary | ICD-10-CM

## 2017-09-28 DIAGNOSIS — I10 Essential (primary) hypertension: Secondary | ICD-10-CM

## 2017-09-28 NOTE — Patient Instructions (Signed)
Dr Croitoru recommends that you schedule a follow-up appointment in 12 months. You will receive a reminder letter in the mail two months in advance. If you don't receive a letter, please call our office to schedule the follow-up appointment.  If you need a refill on your cardiac medications before your next appointment, please call your pharmacy. 

## 2017-09-28 NOTE — Progress Notes (Signed)
Cardiology Office Note    Date:  09/28/2017   ID:  Dominique Hernandez, DOB 02/17/1941, MRN [id number]  PCP:  Philmore Pali, NP  Cardiologist:  Sanda Klein, MD   Chief Complaint  Patient presents with  . Follow-up    pt reports no complaints    History of Present Illness:  Dominique Hernandez is a 77 y.o. female with moderate nonischemic cardiomyopathy but very well compensated congestive heart failure, returning for follow-up. When initially diagnosed in early 2014 LVEF was 15%, no significant CAD. After institution of medical therapy, LVEF increased to 35-40%, but has not been reassessed since June 2014. She has long-standing left bundle branch block.   She continues to have problems with memory, but this does not appear to be much worse than it was a year ago.  She remains fiercely independent.  She denies any problems with shortness of breath when doing her household chores or when walking.  She has not had angina, palpitations, syncope, dizziness, leg edema, orthopnea, PND, focal neurological complaints or intermittent claudication.  She is not taking diuretics.  Recent labs were all within normal range.  She has gained about 4 or 5 pounds since last year, but has no signs of hypervolemia.  She thinks it's because she has been unable to be physically active due to the wet weather.  Past Medical History:  Diagnosis Date  . LBBB (left bundle branch block)   . Lung nodule    RLL  . Nonischemic cardiomyopathy [facility])     Past Surgical History:  Procedure Laterality Date  . ABDOMINAL HYSTERECTOMY    . BLADDER SURGERY    . BREAST SURGERY     breast implants  . LEFT AND RIGHT HEART CATHETERIZATION WITH CORONARY ANGIOGRAM N/A 10/08/2012   Procedure: LEFT AND RIGHT HEART CATHETERIZATION WITH CORONARY ANGIOGRAM;  Surgeon: Troy Sine, MD;  Location: [facility] CATH LAB;  Service: Cardiovascular;  Laterality: N/A;  . RIGHT & LEFT HEART CATH  10/08/2012   20% LAD,catheter induced spasm RCA,EF  15-20%    Current Medications: Outpatient Medications Prior to Visit  Medication Sig Dispense Refill  . aspirin EC 81 MG EC tablet Take 1 tablet (81 mg total) by mouth daily.    . carvedilol (COREG) 12.5 MG tablet TAKE 1 TABLET BY MOUTH 2 TIMES DAILY WITH A MEAL. MUST KEEP APPOINTMENT 09/21/2017 FOR FUTURE REFILLS 180 tablet 0  . donepezil (ARICEPT) 23 MG TABS tablet Take 1 tablet by mouth daily.  0  . enalapril (VASOTEC) 20 MG tablet TAKE 1 TABLET BY MOUTH TWICE A DAY MUST KEEP UPCOMING APPOINTMENT TO CONTINUE REFILLS 60 tablet 0  . memantine (NAMENDA) 10 MG tablet Take 10 mg by mouth 2 (two) times daily.  1  . Omega-3 Fatty Acids (FISH OIL PO) Take 1 capsule by mouth daily.    . pravastatin (PRAVACHOL) 20 MG tablet Take 20 mg by mouth daily.     No facility-administered medications prior to visit.      Allergies:   Patient has no known allergies.   Social History   Socioeconomic History  . Marital status: Married    Spouse name: None  . Number of children: None  . Years of education: None  . Highest education level: None  Social Needs  . Financial resource strain: None  . Food insecurity - worry: None  . Food insecurity - inability: None  . Transportation needs - medical: None  . Transportation needs - non-medical: None  Occupational History  . None  Tobacco Use  . Smoking status: Former Smoker    Last attempt to quit: 10/07/2003    Years since quitting: 13.9  . Smokeless tobacco: Never Used  Substance and Sexual Activity  . Alcohol use: No  . Drug use: No  . Sexual activity: Yes  Other Topics Concern  . None  Social History Narrative  . None     Family History:  The patient's family history includes CAD in her brother, mother, sister, sister, sister, and sister; Other (age of onset: 75) in her father.   ROS:   Please see the history of present illness.    ROS All other systems reviewed and are negative.   PHYSICAL EXAM:   VS:  BP 132/70   Pulse 64   Ht 5'  1" (1.549 m)   Wt 132 lb (59.9 kg)   BMI 24.94 kg/m      General: Alert, oriented x3, no distress, appears lean and fit Head: no evidence of trauma, PERRL, EOMI, no exophtalmos or lid lag, no myxedema, no xanthelasma; normal ears, nose and oropharynx Neck: normal jugular venous pulsations and no hepatojugular reflux; brisk carotid pulses without delay and no carotid bruits Chest: clear to auscultation, no signs of consolidation by percussion or palpation, normal fremitus, symmetrical and full respiratory excursions Cardiovascular: normal position and quality of the apical impulse, regular rhythm, normal first and  paradoxically split second heart sounds, no murmurs, rubs or gallops Abdomen: no tenderness or distention, no masses by palpation, no abnormal pulsatility or arterial bruits, normal bowel sounds, no hepatosplenomegaly Extremities: no clubbing, cyanosis or edema; 2+ radial, ulnar and brachial pulses bilaterally; 2+ right femoral, posterior tibial and dorsalis pedis pulses; 2+ left femoral, posterior tibial and dorsalis pedis pulses; no subclavian or femoral bruits Neurological: grossly nonfocal Psych: Normal mood and affect   Wt Readings from Last 3 Encounters:  09/28/17 132 lb (59.9 kg)  06/29/16 127 lb 9.6 oz (57.9 kg)  06/11/15 128 lb 4 oz (58.2 kg)      Studies/Labs Reviewed:   EKG:  EKG is ordered today.  The ekg ordered today demonstrates sinus rhythm with left axis deviation left bundle branch block, QRS 126 ms, QTC 464 ms  Recent Labs: Total cholesterol 173, HDL 59, LDL 92, triglycerides 109 Glucose 95, creatinine 1.0, K 4.6, TSH 1.6  ASSESSMENT:    1. Chronic combined systolic and diastolic heart failure ([place])   2. Essential hypertension   3. Left bundle branch block   4. Hypercholesterolemia   5. Dementia without behavioral disturbance, unspecified dementia type      PLAN:  In order of problems listed above:  1. CHF: Remains euvolemic clinically,  without the need for diuretics.  Even her family thinks that her functional status is good, NYHA class I-2 2. HTN: Fair control.  At home her blood pressure is usually in the 110-120s. 3. LBBB: Option for CRT-P if her heart failure symptoms worsen. This does not appear justified at this time. Resynchronization might be a little challenging due to her short AV conduction time. 4. HLP: All lipid parameters within desirable range 5. Dementia: On cholinesterase inhibitors which make her a little more prone to bradycardia.  We will not increase the carvedilol further.   Medication Adjustments/Labs and Tests Ordered: Current medicines are reviewed at length with the patient today.  Concerns regarding medicines are outlined above.  Medication changes, Labs and Tests ordered today are listed in the Patient Instructions below.  Patient Instructions  Dr Sallyanne Kuster recommends that you schedule a follow-up appointment in 12 months. You will receive a reminder letter in the mail two months in advance. If you don't receive a letter, please call our office to schedule the follow-up appointment.  If you need a refill on your cardiac medications before your next appointment, please call your pharmacy.    Signed, Sanda Klein, MD  09/28/2017 12:07 PM    [facility], [place], [place]  [address] Phone: [phone]; Fax: [phone]     Cardiology Office Note    Date:  09/28/2017   ID:  Dominique Hernandez, DOB 1940-10-05, MRN [id number]  PCP:  Philmore Pali, NP  Cardiologist:  Sanda Klein, MD   Chief Complaint  Patient presents with  . Follow-up    pt reports no complaints    History of Present Illness:  Dominique Hernandez is a 77 y.o. female with moderate nonischemic cardiomyopathy but very well compensated congestive heart failure, returning for follow-up. When initially diagnosed in early 2014 LVEF was 15%, no significant CAD. After institution of medical  therapy, LVEF increased to 35-40%, but has not been reassessed since June 2014. She has long-standing left bundle branch block. Since her last appointment, she has developed worsening memory and now is taking both Aricept and Namenda. As always she remains fiercely independent and contradicts her children when they suggest that she has any physical limitations. Her son does mention the fact that she becomes short of breath if she has to walk uphill. She does most of her own housework without assistance.  The patient specifically denies any chest pain at rest or with exertion, orthopnea, paroxysmal nocturnal dyspnea, syncope, palpitations, focal neurological deficits, intermittent claudication, lower extremity edema, unexplained weight gain, cough, hemoptysis or wheezing. Her weight is unchanged.  Today, her blood pressure is very high 180/80, rechecked 190/90. She claims that at home her blood pressure monitor typically shows 120/70 and reports that she is nervous in the doctor's office.   Past Medical History:  Diagnosis Date  . LBBB (left bundle branch block)   . Lung nodule    RLL  . Nonischemic cardiomyopathy [facility])     Past Surgical History:  Procedure Laterality Date  . ABDOMINAL HYSTERECTOMY    . BLADDER SURGERY    . BREAST SURGERY     breast implants  . LEFT AND RIGHT HEART CATHETERIZATION WITH CORONARY ANGIOGRAM N/A 10/08/2012   Procedure: LEFT AND RIGHT HEART CATHETERIZATION WITH CORONARY ANGIOGRAM;  Surgeon: Troy Sine, MD;  Location: [facility] CATH LAB;  Service: Cardiovascular;  Laterality: N/A;  . RIGHT & LEFT HEART CATH  10/08/2012   20% LAD,catheter induced spasm RCA,EF 15-20%    Current Medications: Outpatient Medications Prior to Visit  Medication Sig Dispense Refill  . aspirin EC 81 MG EC tablet Take 1 tablet (81 mg total) by mouth daily.    . carvedilol (COREG) 12.5 MG tablet TAKE 1 TABLET BY MOUTH 2 TIMES DAILY WITH A MEAL. MUST KEEP APPOINTMENT 09/21/2017 FOR FUTURE  REFILLS 180 tablet 0  . donepezil (ARICEPT) 23 MG TABS tablet Take 1 tablet by mouth daily.  0  . enalapril (VASOTEC) 20 MG tablet TAKE 1 TABLET BY MOUTH TWICE A DAY MUST KEEP UPCOMING APPOINTMENT TO CONTINUE REFILLS 60 tablet 0  . memantine (NAMENDA) 10 MG tablet Take 10 mg by mouth 2 (two) times daily.  1  . Omega-3 Fatty Acids (FISH OIL PO) Take  1 capsule by mouth daily.    . pravastatin (PRAVACHOL) 20 MG tablet Take 20 mg by mouth daily.     No facility-administered medications prior to visit.      Allergies:   Patient has no known allergies.   Social History   Socioeconomic History  . Marital status: Married    Spouse name: None  . Number of children: None  . Years of education: None  . Highest education level: None  Social Needs  . Financial resource strain: None  . Food insecurity - worry: None  . Food insecurity - inability: None  . Transportation needs - medical: None  . Transportation needs - non-medical: None  Occupational History  . None  Tobacco Use  . Smoking status: Former Smoker    Last attempt to quit: 10/07/2003    Years since quitting: 13.9  . Smokeless tobacco: Never Used  Substance and Sexual Activity  . Alcohol use: No  . Drug use: No  . Sexual activity: Yes  Other Topics Concern  . None  Social History Narrative  . None     Family History:  The patient's family history includes CAD in her brother, mother, sister, sister, sister, and sister; Other (age of onset: 45) in her father.   ROS:   Please see the history of present illness.    ROS All other systems reviewed and are negative.   PHYSICAL EXAM:   VS:  BP 132/70   Pulse 64   Ht 5\' 1"  (1.549 m)   Wt 132 lb (59.9 kg)   BMI 24.94 kg/m     She reports that at home her blood pressure is 120/70. When I rechecked her blood pressure here today was even higher at 190/90. GEN: Well nourished, well developed, in no acute distress  HEENT: normal  Neck: no JVD, carotid bruits, or  masses Cardiac: Paradoxically split second heart sound RRR; no murmurs, rubs, or gallops,no edema  Respiratory:  clear to auscultation bilaterally, normal work of breathing GI: soft, nontender, nondistended, + BS MS: no deformity or atrophy  Skin: warm and dry, no rash Neuro:  Alert and Oriented x 3, Strength and sensation are intact Psych: euthymic mood, full affect  Wt Readings from Last 3 Encounters:  09/28/17 132 lb (59.9 kg)  06/29/16 127 lb 9.6 oz (57.9 kg)  06/11/15 128 lb 4 oz (58.2 kg)      Studies/Labs Reviewed:   EKG:  EKG is ordered today.  The ekg ordered today demonstrates Sinus bradycardia with borderline short PR at about 120 ms, left bundle branch block QRS 126 ms, QTC 453 ms  Recent Labs: Total cholesterol 174, HDL 54, LDL 95, triglycerides 123 Glucose 105, creatinine 0.8, BUN 13, TSH 1.5  ASSESSMENT:    1. Chronic combined systolic and diastolic heart failure ([place])   2. Essential hypertension   3. Left bundle branch block   4. Hypercholesterolemia   5. Dementia without behavioral disturbance, unspecified dementia type      PLAN:  In order of problems listed above:  6. UXN:ATFTDDU to have NYHA functional class II status taking into account her family's report. Does not require diuretics to maintain compensation and does not have any edema. Will increase the enalapril to 20 mg twice daily. Recheck echocardiogram. 7. HTN: Blood pressure was quite high today and we increased the enalapril. Even if this is truly whitecoat hypertension, would like her to be on the highest tolerated dose of ACE inhibitor anyway. The patient and  her family will send [place] some recordings of her blood pressure with her home monitor in a few weeks. 8. LBBB: Option for CRT-P if her heart failure symptoms worsen. This does not appear justified at this time. Resynchronization might be a little challenging due to her short AV conduction time. 9. HLP: All lipid parameters are within the  desirable range, even though she is taking a very low dose of a week statin. 10. Dementia: Atrial bradycardia with simultaneous use of carvedilol and cholinesterase inhibitors. Do not plan to further increase carvedilol.   Medication Adjustments/Labs and Tests Ordered: Current medicines are reviewed at length with the patient today.  Concerns regarding medicines are outlined above.  Medication changes, Labs and Tests ordered today are listed in the Patient Instructions below. Patient Instructions  Dr Sallyanne Kuster recommends that you schedule a follow-up appointment in 12 months. You will receive a reminder letter in the mail two months in advance. If you don't receive a letter, please call our office to schedule the follow-up appointment.  If you need a refill on your cardiac medications before your next appointment, please call your pharmacy.    Signed, Sanda Klein, MD  09/28/2017 12:07 PM    [facility] [place], [place], [place]  [address] Phone: [phone]; Fax: [phone]

## 2017-10-12 ENCOUNTER — Other Ambulatory Visit: Payer: Self-pay | Admitting: Cardiovascular Disease

## 2017-12-13 ENCOUNTER — Other Ambulatory Visit: Payer: Self-pay | Admitting: Cardiovascular Disease

## 2018-06-30 ENCOUNTER — Other Ambulatory Visit: Payer: Self-pay | Admitting: Cardiovascular Disease

## 2018-07-02 NOTE — Telephone Encounter (Signed)
Rx sent to pharmacy   

## 2018-09-04 ENCOUNTER — Telehealth: Payer: Self-pay | Admitting: Cardiovascular Disease

## 2018-09-04 NOTE — Telephone Encounter (Signed)
No changes for now, but please check BP daily (sitting down, at rest for at least 10 minutes before checking) and bring BP log to the upcoming appt. We will decide what to do then. Please lso review her med list and make sure it is accurate. MCr

## 2018-09-04 NOTE — Telephone Encounter (Signed)
Pt husband Synetta Shadow, advised Dr. Victorino December advice and he verbalized understanding and will bring BP readings with them to the 09/20/18 appt... med list has been updated and accurate to what he says she is taking but will bring meds to their appt.

## 2018-09-04 NOTE — Telephone Encounter (Signed)
Pt c/o BP issue: STAT if pt c/o blurred vision, one-sided weakness or slurred speech  1. What are your last 5 BP readings? 180/110 yesterday- he did not know the readings for today, he was at work   2. Are you having any other symptoms (ex. Dizziness, headache, blurred vision, passed out)? no  3. What is your BP issue? Blood Pressure is running real high

## 2018-09-04 NOTE — Telephone Encounter (Signed)
Spoke with the pts husband Synetta Shadow (on [place])... and he reports that the pt has had an elevated BP on and off over the past several days but he does not have them recorded.. her BP yesterday was 180/110 and that is about how it has been when it is elevated...  She denies headache, dizziness, sob, chest pain... he says she is usually resting when he checks it. She is taking her meds every day about the same time.  I advised him to keep checking it and to record the time he takes it and to be sure she has been at rest for at least for 20-30 minutes.. he agrees and verbalized understanding.Marland Kitchen He will let us know if her heart rate is continuously staying high..  Pt has an appt with Dr. Sallyanne Kuster 09/20/18 but will forward to him if he would like to make any changes on the meantime.

## 2018-09-13 ENCOUNTER — Ambulatory Visit: Payer: Medicare Other | Admitting: Cardiovascular Disease

## 2018-09-20 ENCOUNTER — Ambulatory Visit: Payer: Medicare Other | Admitting: Cardiovascular Disease

## 2018-09-20 VITALS — BP 128/70 | HR 77 | Ht 62.0 in | Wt 139.6 lb

## 2018-09-20 DIAGNOSIS — I5042 Chronic combined systolic (congestive) and diastolic (congestive) heart failure: Secondary | ICD-10-CM | POA: Diagnosis not present

## 2018-09-20 DIAGNOSIS — I10 Essential (primary) hypertension: Secondary | ICD-10-CM | POA: Diagnosis not present

## 2018-09-20 DIAGNOSIS — E7800 Pure hypercholesterolemia, unspecified: Secondary | ICD-10-CM

## 2018-09-20 DIAGNOSIS — I447 Left bundle-branch block, unspecified: Secondary | ICD-10-CM

## 2018-09-20 DIAGNOSIS — F0390 Unspecified dementia without behavioral disturbance: Secondary | ICD-10-CM

## 2018-09-20 MED ORDER — PRAVASTATIN SODIUM 40 MG PO TABS: 40.0000 mg | ORAL_TABLET | Freq: Every day | ORAL | 3 refills | Status: DC

## 2018-09-20 NOTE — Progress Notes (Signed)
Cardiology Office Note    Date:  09/21/2018   ID:  Bhavika, Schnider Feb 28, 1941, MRN [id number]  PCP:  Philmore Pali, NP  Cardiologist:  Sanda Klein, MD   Chief Complaint  Patient presents with  . Congestive Heart Failure    History of Present Illness:  Dominique Hernandez is a 78 y.o. female with moderate nonischemic cardiomyopathy but very well compensated congestive heart failure, returning for follow-up. When initially diagnosed in early 2014 LVEF was 15%, no significant CAD. After institution of medical therapy, LVEF increased to 35-40%, stable on echos in 2014 and 2018. She has long-standing left bundle branch block.   Other than very slow progression of memory/cognitive deficits, Dominique Hernandez is doing very well.  The patient specifically denies any chest pain at rest exertion, dyspnea at rest or with exertion, orthopnea, paroxysmal nocturnal dyspnea, syncope, palpitations, focal neurological deficits, intermittent claudication, lower extremity edema, unexplained weight gain, cough, hemoptysis or wheezing.   Past Medical History:  Diagnosis Date  . LBBB (left bundle branch block)   . Lung nodule    RLL  . Nonischemic cardiomyopathy [facility])     Past Surgical History:  Procedure Laterality Date  . ABDOMINAL HYSTERECTOMY    . BLADDER SURGERY    . BREAST SURGERY     breast implants  . LEFT AND RIGHT HEART CATHETERIZATION WITH CORONARY ANGIOGRAM N/A 10/08/2012   Procedure: LEFT AND RIGHT HEART CATHETERIZATION WITH CORONARY ANGIOGRAM;  Surgeon: Troy Sine, MD;  Location: [facility] CATH LAB;  Service: Cardiovascular;  Laterality: N/A;  . RIGHT & LEFT HEART CATH  10/08/2012   20% LAD,catheter induced spasm RCA,EF 15-20%    Current Medications: Outpatient Medications Prior to Visit  Medication Sig Dispense Refill  . aspirin EC 81 MG EC tablet Take 1 tablet (81 mg total) by mouth daily.    . carvedilol (COREG) 12.5 MG tablet TAKE 1 TABLET BY MOUTH 2 TIMES DAILY WITH A MEAL. MUST  KEEP APPOINTMENT 09/21/2017 FOR FUTURE REFILLS 180 tablet 3  . donepezil (ARICEPT) 23 MG TABS tablet Take 1 tablet by mouth daily.  0  . enalapril (VASOTEC) 20 MG tablet TAKE 1 TABLET BY MOUTH TWICE A DAY 180 tablet 1  . memantine (NAMENDA) 10 MG tablet Take 10 mg by mouth 2 (two) times daily.  1  . Omega-3 Fatty Acids (FISH OIL PO) Take 1 capsule by mouth daily.    . pravastatin (PRAVACHOL) 20 MG tablet Take 20 mg by mouth daily.     No facility-administered medications prior to visit.      Allergies:   Patient has no known allergies.   Social History   Socioeconomic History  . Marital status: Married    Spouse name: Not on file  . Number of children: Not on file  . Years of education: Not on file  . Highest education level: Not on file  Occupational History  . Not on file  Social Needs  . Financial resource strain: Not on file  . Food insecurity:    Worry: Not on file    Inability: Not on file  . Transportation needs:    Medical: Not on file    Non-medical: Not on file  Tobacco Use  . Smoking status: Former Smoker    Last attempt to quit: 10/07/2003    Years since quitting: 14.9  . Smokeless tobacco: Never Used  Substance and Sexual Activity  . Alcohol use: No  . Drug use: No  . Sexual activity:  Yes  Lifestyle  . Physical activity:    Days per week: Not on file    Minutes per session: Not on file  . Stress: Not on file  Relationships  . Social connections:    Talks on phone: Not on file    Gets together: Not on file    Attends religious service: Not on file    Active member of club or organization: Not on file    Attends meetings of clubs or organizations: Not on file    Relationship status: Not on file  Other Topics Concern  . Not on file  Social History Narrative  . Not on file     Family History:  The patient's family history includes CAD in her brother, mother, sister, sister, sister, and sister; Other (age of onset: 22) in her father.   ROS:   Please  see the history of present illness.    ROS all other systems are reviewed and are negative   PHYSICAL EXAM:   VS:  BP 128/70   Pulse 77   Ht 5\' 2"  (1.575 m)   Wt 139 lb 9.6 oz (63.3 kg)   BMI 25.53 kg/m       General: Alert, oriented x3, no distress, minimally overweight Head: no evidence of trauma, PERRL, EOMI, no exophtalmos or lid lag, no myxedema, no xanthelasma; normal ears, nose and oropharynx Neck: normal jugular venous pulsations and no hepatojugular reflux; brisk carotid pulses without delay and no carotid bruits Chest: clear to auscultation, no signs of consolidation by percussion or palpation, normal fremitus, symmetrical and full respiratory excursions Cardiovascular: normal position and quality of the apical impulse, regular rhythm, normal first and paradoxically split second heart sounds, no murmurs, rubs or gallops Abdomen: no tenderness or distention, no masses by palpation, no abnormal pulsatility or arterial bruits, normal bowel sounds, no hepatosplenomegaly Extremities: no clubbing, cyanosis or edema; 2+ radial, ulnar and brachial pulses bilaterally; 2+ right femoral, posterior tibial and dorsalis pedis pulses; 2+ left femoral, posterior tibial and dorsalis pedis pulses; no subclavian or femoral bruits Neurological: grossly nonfocal Psych: Normal mood and affect    Wt Readings from Last 3 Encounters:  09/20/18 139 lb 9.6 oz (63.3 kg)  09/28/17 132 lb (59.9 kg)  06/29/16 127 lb 9.6 oz (57.9 kg)      Studies/Labs Reviewed:   EKG:  EKG is ordered today.  It shows sinus rhythm, left atrial abnormality, left bundle branch block with QRS 128 ms, QTC 493 ms Recent Labs: Labs from June 21, 2018 Total cholesterol 183, HDL 56, LDL 109, triglycerides 91 creatinine 0.86, K 4.1, TSH 1.69  ASSESSMENT:    1. Chronic combined systolic (congestive) and diastolic (congestive) heart failure ([place])   2. Essential hypertension   3. LBBB (left bundle branch block)   4.  Hypercholesterolemia   5. Dementia without behavioral disturbance, unspecified dementia type ([place])      PLAN:  In order of problems listed above:  1. CHF: NYHA functional class I-2, without need for loop diuretics, clinically euvolemic.  On ACE inhibitor and carvedilol. 2. HTN: Well-controlled. 3. LBBB: Option for CRT-P if her heart failure symptoms worsen. This does not appear justified at this time. Resynchronization might be a little challenging due to her short AV conduction time. 4. HLP: Lipid parameters are acceptable, she has nonischemic cardiomyopathy 5. Dementia: Some benefit from cholinesterase inhibitors.   Medication Adjustments/Labs and Tests Ordered: Current medicines are reviewed at length with the patient today.  Concerns regarding  medicines are outlined above.  Medication changes, Labs and Tests ordered today are listed in the Patient Instructions below. Patient Instructions  Medication Instructions:  INCREASE PRAVASTATIN TO 40 MG ONCE DAILY= 2 OF THE 20 MG TABLETS ONCE DAILY If you need a refill on your cardiac medications before your next appointment, please call your pharmacy.   Lab work: Your physician recommends that you return for lab work in: [place] If you have labs (blood work) drawn today and your tests are completely normal, you will receive your results only by: Marland Kitchen MyChart Message (if you have MyChart) OR . A paper copy in the mail If you have any lab test that is abnormal or we need to change your treatment, we will call you to review the results.  Follow-Up: At [facility], you and your health needs are our priority.  As part of our continuing mission to provide you with exceptional heart care, we have created designated Provider Care Teams.  These Care Teams include your primary Cardiologist (physician) and Advanced Practice Providers (APPs -  Physician Assistants and Nurse Practitioners) who all work together to provide you with the  care you need, when you need it. You will need a follow up appointment in 12 months.  Please call our office 2 months in advance to schedule this appointment.  You may see Sanda Klein, MD or one of the following Advanced Practice Providers on your designated Care Team: [place], [place] . Fabian Sharp, PA-C        Signed, Sanda Klein, MD  09/21/2018 11:16 AM    [facility] [place], [place], [place]  [address] Phone: [phone]; Fax: [phone]     Cardiology Office Note    Date:  09/21/2018   ID:  Salah, Burlison 08-11-1940, MRN [id number]  PCP:  Philmore Pali, NP  Cardiologist:  Sanda Klein, MD   Chief Complaint  Patient presents with  . Congestive Heart Failure    History of Present Illness:  Dominique Hernandez is a 78 y.o. female with moderate nonischemic cardiomyopathy but very well compensated congestive heart failure, returning for follow-up. When initially diagnosed in early 2014 LVEF was 15%, no significant CAD. After institution of medical therapy, LVEF increased to 35-40%, but has not been reassessed since June 2014. She has long-standing left bundle branch block. Since her last appointment, she has developed worsening memory and now is taking both Aricept and Namenda. As always she remains fiercely independent and contradicts her children when they suggest that she has any physical limitations. Her son does mention the fact that she becomes short of breath if she has to walk uphill. She does most of her own housework without assistance.  The patient specifically denies any chest pain at rest or with exertion, orthopnea, paroxysmal nocturnal dyspnea, syncope, palpitations, focal neurological deficits, intermittent claudication, lower extremity edema, unexplained weight gain, cough, hemoptysis or wheezing. Her weight is unchanged.  Today, her blood pressure is very high 180/80, rechecked 190/90. She claims that at home her blood  pressure monitor typically shows 120/70 and reports that she is nervous in the doctor's office.   Past Medical History:  Diagnosis Date  . LBBB (left bundle branch block)   . Lung nodule    RLL  . Nonischemic cardiomyopathy [facility])     Past Surgical History:  Procedure Laterality Date  . ABDOMINAL HYSTERECTOMY    . BLADDER SURGERY    .  BREAST SURGERY     breast implants  . LEFT AND RIGHT HEART CATHETERIZATION WITH CORONARY ANGIOGRAM N/A 10/08/2012   Procedure: LEFT AND RIGHT HEART CATHETERIZATION WITH CORONARY ANGIOGRAM;  Surgeon: Troy Sine, MD;  Location: [facility] CATH LAB;  Service: Cardiovascular;  Laterality: N/A;  . RIGHT & LEFT HEART CATH  10/08/2012   20% LAD,catheter induced spasm RCA,EF 15-20%    Current Medications: Outpatient Medications Prior to Visit  Medication Sig Dispense Refill  . aspirin EC 81 MG EC tablet Take 1 tablet (81 mg total) by mouth daily.    . carvedilol (COREG) 12.5 MG tablet TAKE 1 TABLET BY MOUTH 2 TIMES DAILY WITH A MEAL. MUST KEEP APPOINTMENT 09/21/2017 FOR FUTURE REFILLS 180 tablet 3  . donepezil (ARICEPT) 23 MG TABS tablet Take 1 tablet by mouth daily.  0  . enalapril (VASOTEC) 20 MG tablet TAKE 1 TABLET BY MOUTH TWICE A DAY 180 tablet 1  . memantine (NAMENDA) 10 MG tablet Take 10 mg by mouth 2 (two) times daily.  1  . Omega-3 Fatty Acids (FISH OIL PO) Take 1 capsule by mouth daily.    . pravastatin (PRAVACHOL) 20 MG tablet Take 20 mg by mouth daily.     No facility-administered medications prior to visit.      Allergies:   Patient has no known allergies.   Social History   Socioeconomic History  . Marital status: Married    Spouse name: Not on file  . Number of children: Not on file  . Years of education: Not on file  . Highest education level: Not on file  Occupational History  . Not on file  Social Needs  . Financial resource strain: Not on file  . Food insecurity:    Worry: Not on file    Inability: Not on file  . Transportation  needs:    Medical: Not on file    Non-medical: Not on file  Tobacco Use  . Smoking status: Former Smoker    Last attempt to quit: 10/07/2003    Years since quitting: 14.9  . Smokeless tobacco: Never Used  Substance and Sexual Activity  . Alcohol use: No  . Drug use: No  . Sexual activity: Yes  Lifestyle  . Physical activity:    Days per week: Not on file    Minutes per session: Not on file  . Stress: Not on file  Relationships  . Social connections:    Talks on phone: Not on file    Gets together: Not on file    Attends religious service: Not on file    Active member of club or organization: Not on file    Attends meetings of clubs or organizations: Not on file    Relationship status: Not on file  Other Topics Concern  . Not on file  Social History Narrative  . Not on file     Family History:  The patient's family history includes CAD in her brother, mother, sister, sister, sister, and sister; Other (age of onset: 87) in her father.   ROS:   Please see the history of present illness.    ROS All other systems reviewed and are negative.   PHYSICAL EXAM:   VS:  BP 128/70   Pulse 77   Ht 5\' 2"  (1.575 m)   Wt 139 lb 9.6 oz (63.3 kg)   BMI 25.53 kg/m     She reports that at home her blood pressure is 120/70. When I rechecked her  blood pressure here today was even higher at 190/90. GEN: Well nourished, well developed, in no acute distress  HEENT: normal  Neck: no JVD, carotid bruits, or masses Cardiac: Paradoxically split second heart sound RRR; no murmurs, rubs, or gallops,no edema  Respiratory:  clear to auscultation bilaterally, normal work of breathing GI: soft, nontender, nondistended, + BS MS: no deformity or atrophy  Skin: warm and dry, no rash Neuro:  Alert and Oriented x 3, Strength and sensation are intact Psych: euthymic mood, full affect  Wt Readings from Last 3 Encounters:  09/20/18 139 lb 9.6 oz (63.3 kg)  09/28/17 132 lb (59.9 kg)  06/29/16 127 lb  9.6 oz (57.9 kg)      Studies/Labs Reviewed:   EKG:  EKG is ordered today.  The ekg ordered today demonstrates Sinus bradycardia with borderline short PR at about 120 ms, left bundle branch block QRS 126 ms, QTC 453 ms  Recent Labs: Total cholesterol 174, HDL 54, LDL 95, triglycerides 123 Glucose 105, creatinine 0.8, BUN 13, TSH 1.5  ASSESSMENT:    1. Chronic combined systolic (congestive) and diastolic (congestive) heart failure ([place])   2. Essential hypertension   3. LBBB (left bundle branch block)   4. Hypercholesterolemia   5. Dementia without behavioral disturbance, unspecified dementia type ([place])      PLAN:  In order of problems listed above:  6. QIW:[id number] to have NYHA functional class II status taking into account her family's report. Does not require diuretics to maintain compensation and does not have any edema. Will increase the enalapril to 20 mg twice daily. Recheck echocardiogram. 7. HTN: Blood pressure was quite high today and we increased the enalapril. Even if this is truly whitecoat hypertension, would like her to be on the highest tolerated dose of ACE inhibitor anyway. The patient and her family will send [place] some recordings of her blood pressure with her home monitor in a few weeks. 8. LBBB: Option for CRT-P if her heart failure symptoms worsen. This does not appear justified at this time. Resynchronization might be a little challenging due to her short AV conduction time. 9. HLP: All lipid parameters are within the desirable range, even though she is taking a very low dose of a week statin. 10. Dementia: Atrial bradycardia with simultaneous use of carvedilol and cholinesterase inhibitors. Do not plan to further increase carvedilol.   Medication Adjustments/Labs and Tests Ordered: Current medicines are reviewed at length with the patient today.  Concerns regarding medicines are outlined above.  Medication changes, Labs and Tests ordered today are listed in the  Patient Instructions below. Patient Instructions  Medication Instructions:  INCREASE PRAVASTATIN TO 40 MG ONCE DAILY= 2 OF THE 20 MG TABLETS ONCE DAILY If you need a refill on your cardiac medications before your next appointment, please call your pharmacy.   Lab work: Your physician recommends that you return for lab work in: [place] If you have labs (blood work) drawn today and your tests are completely normal, you will receive your results only by: Marland Kitchen MyChart Message (if you have MyChart) OR . A paper copy in the mail If you have any lab test that is abnormal or we need to change your treatment, we will call you to review the results.  Follow-Up: At [facility], you and your health needs are our priority.  As part of our continuing mission to provide you with exceptional heart care, we have created designated Provider Care Teams.  These Care  Teams include your primary Cardiologist (physician) and Advanced Practice Providers (APPs -  Physician Assistants and Nurse Practitioners) who all work together to provide you with the care you need, when you need it. You will need a follow up appointment in 12 months.  Please call our office 2 months in advance to schedule this appointment.  You may see Sanda Klein, MD or one of the following Advanced Practice Providers on your designated Care Team: [place], [place] . Fabian Sharp, PA-C        Signed, Sanda Klein, MD  09/21/2018 11:16 AM    [facility] [address], [place], [place]  [address] Phone: [phone]; Fax: [phone]

## 2018-09-20 NOTE — Patient Instructions (Signed)
Medication Instructions:  INCREASE PRAVASTATIN TO 40 MG ONCE DAILY= 2 OF THE 20 MG TABLETS ONCE DAILY If you need a refill on your cardiac medications before your next appointment, please call your pharmacy.   Lab work: Your physician recommends that you return for lab work in: [place] If you have labs (blood work) drawn today and your tests are completely normal, you will receive your results only by: Marland Kitchen MyChart Message (if you have MyChart) OR . A paper copy in the mail If you have any lab test that is abnormal or we need to change your treatment, we will call you to review the results.  Follow-Up: At [facility], you and your health needs are our priority.  As part of our continuing mission to provide you with exceptional heart care, we have created designated Provider Care Teams.  These Care Teams include your primary Cardiologist (physician) and Advanced Practice Providers (APPs -  Physician Assistants and Nurse Practitioners) who all work together to provide you with the care you need, when you need it. You will need a follow up appointment in 12 months.  Please call our office 2 months in advance to schedule this appointment.  You may see Sanda Klein, MD or one of the following Advanced Practice Providers on your designated Care Team: [place], [place] . Fabian Sharp, PA-C

## 2018-09-21 ENCOUNTER — Encounter: Payer: Self-pay | Admitting: Cardiovascular Disease

## 2018-09-21 DIAGNOSIS — G309 Alzheimer's disease, unspecified: Secondary | ICD-10-CM

## 2018-09-21 DIAGNOSIS — F0280 Dementia in other diseases classified elsewhere without behavioral disturbance: Secondary | ICD-10-CM | POA: Insufficient documentation

## 2018-10-04 ENCOUNTER — Encounter: Payer: Self-pay | Admitting: Neurology

## 2018-10-04 ENCOUNTER — Telehealth: Payer: Self-pay | Admitting: Neurology

## 2018-10-04 ENCOUNTER — Ambulatory Visit: Payer: Medicare Other | Admitting: Neurology

## 2018-10-04 VITALS — BP 181/83 | HR 61 | Ht 62.5 in | Wt 137.0 lb

## 2018-10-04 DIAGNOSIS — G301 Alzheimer's disease with late onset: Secondary | ICD-10-CM

## 2018-10-04 DIAGNOSIS — R413 Other amnesia: Secondary | ICD-10-CM | POA: Diagnosis not present

## 2018-10-04 DIAGNOSIS — F0280 Dementia in other diseases classified elsewhere without behavioral disturbance: Secondary | ICD-10-CM | POA: Diagnosis not present

## 2018-10-04 NOTE — Progress Notes (Signed)
Reason for visit: Memory disturbance, Alzheimer's disease  Referring physician: Dr. Bradly Bienenstock Dominique Hernandez is a 78 y.o. female  History of present illness:  Dominique Hernandez is a 78 year old right-handed white female with history of a progressive memory disturbance since 2015.  At that time, she was scoring 29/30, bad September 2017 she was scoring 21/30.  The patient has been on maximum therapy with 23 mg daily of Aricept and Namenda 10 mg twice daily.  The patient has continued to progress, she has had some occasional episodes of agitation.  She has more recently had significant progression of word finding troubles and she has developed a nonfluent aphasia at this point.  The patient is still able to bathe and dress herself, she can do some housework and some cooking, she does not operate a motor vehicle.  She is quite mobile, she denies any problems with weakness or numbness of the extremities or difficulty controlling the bowels or the bladder.  She sleeps well at night.  She is not having hallucinations.  Her mother also had Alzheimer's disease.  She comes to this office for further evaluation.  The family claims that she has never had a head scan.  Past Medical History:  Diagnosis Date  . LBBB (left bundle branch block)   . Lung nodule    RLL  . Nonischemic cardiomyopathy [facility])     Past Surgical History:  Procedure Laterality Date  . ABDOMINAL HYSTERECTOMY    . BLADDER SURGERY    . BREAST SURGERY     breast implants  . LEFT AND RIGHT HEART CATHETERIZATION WITH CORONARY ANGIOGRAM N/A 10/08/2012   Procedure: LEFT AND RIGHT HEART CATHETERIZATION WITH CORONARY ANGIOGRAM;  Surgeon: Troy Sine, MD;  Location: [facility] CATH LAB;  Service: Cardiovascular;  Laterality: N/A;  . RIGHT & LEFT HEART CATH  10/08/2012   20% LAD,catheter induced spasm RCA,EF 15-20%    Family History  Problem Relation Age of Onset  . CAD Mother   . Other Father 49       rocky mountain spotted fever  . CAD  Sister   . CAD Brother   . CAD Sister   . CAD Sister   . CAD Sister     Social history:  reports that she quit smoking about 15 years ago. She has never used smokeless tobacco. She reports that she does not drink alcohol or use drugs.  Medications:  Prior to Admission medications   Medication Sig Start Date End Date Taking? Authorizing Provider  aspirin EC 81 MG EC tablet Take 1 tablet (81 mg total) by mouth daily. 10/10/12  Yes Kilroy, Luke K, PA-C  carvedilol (COREG) 12.5 MG tablet TAKE 1 TABLET BY MOUTH 2 TIMES DAILY WITH A MEAL. MUST KEEP APPOINTMENT 09/21/2017 FOR FUTURE REFILLS 12/13/17  Yes Croitoru, Mihai, MD  donepezil (ARICEPT) 23 MG TABS tablet Take 1 tablet by mouth daily. 04/11/16  Yes [provider]  enalapril (VASOTEC) 20 MG tablet TAKE 1 TABLET BY MOUTH TWICE A DAY 07/02/18  Yes Croitoru, Mihai, MD  memantine (NAMENDA) 10 MG tablet Take 10 mg by mouth 2 (two) times daily. 04/21/16  Yes [provider]  Omega-3 Fatty Acids (FISH OIL PO) Take 1 capsule by mouth daily.   Yes [provider]  pravastatin (PRAVACHOL) 40 MG tablet Take 1 tablet (40 mg total) by mouth daily. 09/20/18  Yes Croitoru, Mihai, MD     No Known Allergies  ROS:  Out of a complete  14 system review of symptoms, the patient complains only of the following symptoms, and all other reviewed systems are negative.  Memory loss, confusion, slurred speech  Blood pressure (!) 181/83, pulse 61, height 5' 2.5" (1.588 m), weight 137 lb (62.1 kg).  Physical Exam  General: The patient is alert and cooperative at the time of the examination.  Eyes: Pupils are equal, round, and reactive to light. Discs are flat bilaterally.  Neck: The neck is supple, no carotid bruits are noted.  Respiratory: The respiratory examination is clear.  Cardiovascular: The cardiovascular examination reveals a regular rate and rhythm, no obvious murmurs or rubs are noted.  Skin: Extremities are without  significant edema.  Neurologic Exam  Mental status: The patient is alert and oriented x 3 at the time of the examination. The Mini-Mental status examination done today shows a total score of 19/30.  Cranial nerves: Facial symmetry is present. There is good sensation of the face to pinprick and soft touch bilaterally. The strength of the facial muscles and the muscles to head turning and shoulder shrug are normal bilaterally. Speech is nonfluent, aphasic, not dysarthric. Extraocular movements are full. Visual fields are full. The tongue is midline, and the patient has symmetric elevation of the soft palate. No obvious hearing deficits are noted.  Motor: The motor testing reveals 5 over 5 strength of all 4 extremities. Good symmetric motor tone is noted throughout.  Sensory: Sensory testing is intact to pinprick, soft touch, vibration sensation, and position sense on all 4 extremities. No evidence of extinction is noted.  Coordination: Cerebellar testing reveals good finger-nose-finger and heel-to-shin bilaterally.  Gait and station: Gait is normal. Tandem gait is normal. Romberg is negative. No drift is seen.  Reflexes: Deep tendon reflexes are symmetric and normal bilaterally. Toes are downgoing bilaterally.   Assessment/Plan:  1.  Progressive memory disturbance, Alzheimer's disease  The patient has had a 5-year history of a progressive memory disturbance, she has developed significant problems with word finding to the point that she has a nonfluent aphasia at this time.  The patient will undergo CT scan of the brain to exclude cerebrovascular disease.  The patient is on maximal medical therapy for her memory issue, they are to continue the above medications.  The patient is having some troubles with agitation, but not to the degree that the family believes that she needs medication for it.  They will follow-up here if needed.  Jill Alexanders MD 10/04/2018 9:32 AM  [facility] [address] [place], [place] [address]  Phone [phone] Fax [phone]

## 2018-10-04 NOTE — Telephone Encounter (Signed)
[employer] medicare order sent to GI. No auth they will reach out to the pt to schedule.  °

## 2018-10-18 ENCOUNTER — Ambulatory Visit
Admission: RE | Admit: 2018-10-18 | Discharge: 2018-10-18 | Disposition: A | Discharge status: Home / Self Care | Payer: Medicare Other | Source: Ambulatory Visit | Attending: Neurology | Admitting: Neurology

## 2018-10-18 DIAGNOSIS — R413 Other amnesia: Secondary | ICD-10-CM

## 2018-10-19 ENCOUNTER — Telehealth: Payer: Self-pay | Admitting: Neurology

## 2018-10-19 NOTE — Telephone Encounter (Signed)
  I called the patient, talk with the husband.  The CT of the head shows generalized atrophy, no evidence of significant cerebrovascular disease that would explain her severe aphasia.  This is likely related to an Alzheimer's process.  The patient is on high-dose donepezil and Namenda.   CT brain 10/18/18:  IMPRESSION: This CT scan of the head without contrast shows the following: 1.    There is moderate atrophy of the mesial temporal lobes and milder generalized atrophy elsewhere in the hemispheres. 2.    Chronic microvascular ischemic changes. 3.    There are no acute findings.

## 2018-12-08 ENCOUNTER — Other Ambulatory Visit: Payer: Self-pay | Admitting: Cardiovascular Disease

## 2018-12-09 NOTE — Telephone Encounter (Signed)
Carvedilol refilled.

## 2018-12-27 ENCOUNTER — Other Ambulatory Visit: Payer: Self-pay | Admitting: Cardiovascular Disease

## 2019-08-18 ENCOUNTER — Encounter: Payer: Self-pay | Admitting: Cardiovascular Disease

## 2019-08-18 ENCOUNTER — Telehealth: Payer: Self-pay | Admitting: Cardiovascular Disease

## 2019-08-18 NOTE — Telephone Encounter (Signed)
  We are recommending the COVID-19 vaccine to all of our patients. Cardiac medications (including blood thinners) should not deter anyone from being vaccinated and there is no need to hold any of those medications prior to vaccine administration.     Currently, there is a hotline to call (active 08/08/19) to schedule vaccination appointments as no walk-ins will be accepted.   Number: [phone]    If you have further questions or concerns about the vaccine process, please visit [email or web address] or contact your primary care physician.   Advised husband of the above information

## 2019-08-18 NOTE — Telephone Encounter (Signed)
Error

## 2019-08-26 ENCOUNTER — Ambulatory Visit: Payer: Medicare Other

## 2019-09-04 ENCOUNTER — Ambulatory Visit: Payer: Medicare Other

## 2019-09-06 ENCOUNTER — Ambulatory Visit: Payer: Medicare Other

## 2019-10-24 ENCOUNTER — Ambulatory Visit: Payer: Medicare Other | Admitting: Cardiovascular Disease

## 2019-11-30 ENCOUNTER — Other Ambulatory Visit: Payer: Self-pay | Admitting: Cardiovascular Disease

## 2019-12-17 ENCOUNTER — Other Ambulatory Visit: Payer: Self-pay | Admitting: Cardiovascular Disease

## 2019-12-26 ENCOUNTER — Ambulatory Visit: Payer: Medicare Other | Admitting: Physician Assistant

## 2019-12-26 ENCOUNTER — Other Ambulatory Visit: Payer: Self-pay

## 2019-12-26 ENCOUNTER — Encounter: Payer: Self-pay | Admitting: Physician Assistant

## 2019-12-26 VITALS — BP 150/70 | HR 57 | Temp 96.8°F | Ht 62.0 in | Wt 133.6 lb

## 2019-12-26 DIAGNOSIS — I10 Essential (primary) hypertension: Secondary | ICD-10-CM

## 2019-12-26 DIAGNOSIS — F0280 Dementia in other diseases classified elsewhere without behavioral disturbance: Secondary | ICD-10-CM

## 2019-12-26 DIAGNOSIS — I428 Other cardiomyopathies: Secondary | ICD-10-CM | POA: Diagnosis not present

## 2019-12-26 DIAGNOSIS — I2510 Atherosclerotic heart disease of native coronary artery without angina pectoris: Secondary | ICD-10-CM

## 2019-12-26 DIAGNOSIS — G301 Alzheimer's disease with late onset: Secondary | ICD-10-CM | POA: Diagnosis not present

## 2019-12-26 NOTE — Patient Instructions (Signed)
Medication Instructions:  Your physician recommends that you continue on your current medications as directed. Please refer to the Current Medication list given to you today.  *If you need a refill on your cardiac medications before your next appointment, please call your pharmacy*  Lab Work: NONE ordered at this time of appointment   If you have labs (blood work) drawn today and your tests are completely normal, you will receive your results only by: Marland Kitchen MyChart Message (if you have MyChart) OR . A paper copy in the mail If you have any lab test that is abnormal or we need to change your treatment, we will call you to review the results.  Testing/Procedures: Your physician has requested that you have an echocardiogram. Echocardiography is a painless test that uses sound waves to create images of your heart. It provides your doctor with information about the size and shape of your heart and how well your heart's chambers and valves are working. This procedure takes approximately one hour. There are no restrictions for this procedure. This test is done at [address]   Please schedule within 1 year prior to 1 year follow up appointment   Follow-Up: At [facility], you and your health needs are our priority.  As part of our continuing mission to provide you with exceptional heart care, we have created designated Provider Care Teams.  These Care Teams include your primary Cardiologist (physician) and Advanced Practice Providers (APPs -  Physician Assistants and Nurse Practitioners) who all work together to provide you with the care you need, when you need it.  Your next appointment:   1 year(s)  The format for your next appointment:   In Person  Provider:   Sanda Klein, MD  Other Instructions

## 2019-12-26 NOTE — Progress Notes (Signed)
Cardiology Office Note:    Date:  12/28/2019   ID:  Dominique Hernandez, DOB 25-Mar-1941, MRN [id number]  PCP:  Philmore Pali, NP  Cardiologist:  Sanda Klein, MD  Electrophysiologist:  None   Referring MD: Philmore Pali, NP   Chief Complaint  Patient presents with  . Follow-up    seen for Dr. Sallyanne Kuster    History of Present Illness:    Dominique Hernandez is a 79 y.o. female with a hx of NICM, Alzheimer's disease and mild CAD.  Patient was diagnosed with nonischemic cardiomyopathy in early 2014 with echocardiogram showed EF 15%.  Cardiac catheterization at the time showed minimal CAD.  After heart failure therapy, ejection fraction improved to 35 to 40% on echocardiogram in 2014 and the 2018.  Patient was last seen by Dr. Tylene Fantasia February 2020 at which time she had very slow progression of memory decline, she had late onset Alzheimer's.  Otherwise she denies any cardiac symptoms.  Her cognitive issues being followed by Dr. Jannifer Franklin of neurology.  Although Dr. Victorino December note mentioned to recheck echocardiogram, however I do not see this was ever ordered.  Enalapril was increased to 20 mg twice daily during the last office visit.  Patient presents today for cardiology office visit.  She denies any exertional chest pain or shortness of breath recently.  She is very active at home and walks her dog as well.  Her family member including her son and her sister constantly checks on her.  Her short-term memory is affected however her long-term memory seems to be intact.  She denies any lower extremity edema, orthopnea or PND.  I would like her to have a repeat echocardiogram in the next year.  Although her blood pressure was in the 180s when she saw Dr. Jannifer Franklin of neurology in March [id number], her systolic blood pressure is now down to the 150s on today's exam.  According to her son who checked her blood pressure at home, her systolic blood pressure running around 120s at home, they suspect patient has a  component of whitecoat syndrome as her blood pressure is always higher in the doctor's office.  I decided to hold off on adjusting her blood pressure medication due to this reason.   Past Medical History:  Diagnosis Date  . LBBB (left bundle branch block)   . Lung nodule    RLL  . Nonischemic cardiomyopathy [facility])     Past Surgical History:  Procedure Laterality Date  . ABDOMINAL HYSTERECTOMY    . BLADDER SURGERY    . BREAST SURGERY     breast implants  . LEFT AND RIGHT HEART CATHETERIZATION WITH CORONARY ANGIOGRAM N/A 10/08/2012   Procedure: LEFT AND RIGHT HEART CATHETERIZATION WITH CORONARY ANGIOGRAM;  Surgeon: Troy Sine, MD;  Location: [facility] CATH LAB;  Service: Cardiovascular;  Laterality: N/A;  . RIGHT & LEFT HEART CATH  10/08/2012   20% LAD,catheter induced spasm RCA,EF 15-20%    Current Medications: Current Meds  Medication Sig  . aspirin EC 81 MG EC tablet Take 1 tablet (81 mg total) by mouth daily.  . carvedilol (COREG) 12.5 MG tablet Take 1 tablet (12.5 mg total) by mouth 2 (two) times daily with a meal. Patient needs an overdue office visit for further refills. 1st attempt.  . donepezil (ARICEPT) 23 MG TABS tablet Take 1 tablet by mouth daily.  . enalapril (VASOTEC) 20 MG tablet TAKE 1 TABLET BY MOUTH TWICE A DAY  . memantine (NAMENDA) 10 MG tablet  Take 10 mg by mouth 2 (two) times daily.  . Omega-3 Fatty Acids (FISH OIL PO) Take 1 capsule by mouth daily.  . pravastatin (PRAVACHOL) 40 MG tablet Take 1 tablet (40 mg total) by mouth daily.     Allergies:   Patient has no known allergies.   Social History   Socioeconomic History  . Marital status: Married    Spouse name: Not on file  . Number of children: Not on file  . Years of education: Not on file  . Highest education level: Not on file  Occupational History  . Not on file  Tobacco Use  . Smoking status: Former Smoker    Quit date: 10/07/2003    Years since quitting: 16.2  . Smokeless tobacco: Never Used    Substance and Sexual Activity  . Alcohol use: No  . Drug use: No  . Sexual activity: Yes  Other Topics Concern  . Not on file  Social History Narrative  . Not on file   Social Determinants of Health   Financial Resource Strain:   . Difficulty of Paying Living Expenses:   Food Insecurity:   . Worried About Charity fundraiser in the Last Year:   . Arboriculturist in the Last Year:   Transportation Needs:   . Film/video editor (Medical):   Marland Kitchen Lack of Transportation (Non-Medical):   Physical Activity:   . Days of Exercise per Week:   . Minutes of Exercise per Session:   Stress:   . Feeling of Stress :   Social Connections:   . Frequency of Communication with Friends and Family:   . Frequency of Social Gatherings with Friends and Family:   . Attends Religious Services:   . Active Member of Clubs or Organizations:   . Attends Archivist Meetings:   Marland Kitchen Marital Status:      Family History: The patient's family history includes CAD in her brother, mother, sister, sister, sister, and sister; Dementia in her mother; Other (age of onset: 40) in her father.  ROS:   Please see the history of present illness.     All other systems reviewed and are negative.  EKGs/Labs/Other Studies Reviewed:    The following studies were reviewed today:  Echo 08/11/2012 LV EF: 35% -  40%   -------------------------------------------------------------------  Indications:   (I50.22).   -------------------------------------------------------------------  History:  PMH: LBBB. NICM. Acquired from the patient and from the  patient&'s chart. Risk factors: Hypertension. Dyslipidemia.   -------------------------------------------------------------------  Study Conclusions   - Left ventricle: The cavity size was normal. There was mild  concentric hypertrophy. Systolic function was moderately reduced.  The estimated ejection fraction was in the range of 35% to 40%.   Diffuse hypokinesis. Doppler parameters are consistent with  abnormal left ventricular relaxation (grade 1 diastolic  dysfunction). Doppler parameters are consistent with elevated  ventricular end-diastolic filling pressure.  - Aortic valve: Trileaflet; normal thickness leaflets. There was  mild regurgitation.  - Aortic root: The aortic root was normal in size.  - Mitral valve: There was mild regurgitation.  - Left atrium: The atrium was normal in size.  - Right ventricle: Systolic function was normal.  - Right atrium: The atrium was normal in size.  - Tricuspid valve: There was mild regurgitation.  - Pulmonary arteries: Systolic pressure was within the normal  range.  - Inferior vena cava: The vessel was normal in size.  - Pericardium, extracardiac: There was no pericardial effusion.  Impressions:   - No significant change since the prior study on 01/03/2013.   EKG:  EKG is ordered today.  The ekg ordered today demonstrates normal sinus rhythm, left bundle branch block, otherwise no significant ST-T wave changes.  Recent Labs: No results found for requested labs within last 8760 hours.  Recent Lipid Panel No results found for: CHOL, TRIG, HDL, CHOLHDL, VLDL, LDLCALC, LDLDIRECT  Physical Exam:    VS:  BP (!) 150/70   Pulse (!) 57   Temp (!) 96.8 F (36 C)   Ht 5\' 2"  (1.575 m)   Wt 133 lb 9.6 oz (60.6 kg)   SpO2 95%   BMI 24.44 kg/m     Wt Readings from Last 3 Encounters:  12/26/19 133 lb 9.6 oz (60.6 kg)  10/04/18 137 lb (62.1 kg)  09/20/18 139 lb 9.6 oz (63.3 kg)     GEN:  Well nourished, well developed in no acute distress HEENT: Normal NECK: No JVD; No carotid bruits LYMPHATICS: No lymphadenopathy CARDIAC: RRR, no murmurs, rubs, gallops RESPIRATORY:  Clear to auscultation without rales, wheezing or rhonchi  ABDOMEN: Soft, non-tender, non-distended MUSCULOSKELETAL:  No edema; No deformity  SKIN: Warm and dry NEUROLOGIC:  Alert and oriented x  3 PSYCHIATRIC:  Normal affect   ASSESSMENT:    1. Nonischemic cardiomyopathy ([place])   2. Coronary artery disease involving native coronary artery of native heart without angina pectoris   3. Essential hypertension   4. Late onset Alzheimer's disease without behavioral disturbance ([place])    PLAN:    In order of problems listed above:  1. Nonischemic cardiomyopathy: Diagnosed in 2014, last echocardiogram showed EF improved from the previous 15% to 35%.  Plan for repeat echocardiogram  2. CAD: Minimal CAD noted on previous cardiac catheterization in 2014.  She has denied any recent exertional chest pain or shortness of breath  3. Hypertension: During the previous office visit, her enalapril was increased.  Her blood pressure remains elevated today, however her son suspect she has a component of whitecoat syndrome as her systolic blood pressure is usually in the 120s at home  4. Late onset Alzheimer's disease: Short-term memory is quite poor, however her long-term memory is intact.   Medication Adjustments/Labs and Tests Ordered: Current medicines are reviewed at length with the patient today.  Concerns regarding medicines are outlined above.  Orders Placed This Encounter  Procedures  . EKG 12-Lead  . ECHOCARDIOGRAM COMPLETE   No orders of the defined types were placed in this encounter.   Patient Instructions  Medication Instructions:  Your physician recommends that you continue on your current medications as directed. Please refer to the Current Medication list given to you today.  *If you need a refill on your cardiac medications before your next appointment, please call your pharmacy*  Lab Work: NONE ordered at this time of appointment   If you have labs (blood work) drawn today and your tests are completely normal, you will receive your results only by: Marland Kitchen MyChart Message (if you have MyChart) OR . A paper copy in the mail If you have any lab test that is abnormal or we  need to change your treatment, we will call you to review the results.  Testing/Procedures: Your physician has requested that you have an echocardiogram. Echocardiography is a painless test that uses sound waves to create images of your heart. It provides your doctor with information about the size and shape of your heart and how well your heart's chambers  and valves are working. This procedure takes approximately one hour. There are no restrictions for this procedure. This test is done at [address]   Please schedule within 1 year prior to 1 year follow up appointment   Follow-Up: At [facility], you and your health needs are our priority.  As part of our continuing mission to provide you with exceptional heart care, we have created designated Provider Care Teams.  These Care Teams include your primary Cardiologist (physician) and Advanced Practice Providers (APPs -  Physician Assistants and Nurse Practitioners) who all work together to provide you with the care you need, when you need it.  Your next appointment:   1 year(s)  The format for your next appointment:   In Person  Provider:   Sanda Klein, MD  Other Instructions      Signed, Almyra Deforest, [place]  12/28/2019 9:56 PM    [place]

## 2019-12-28 ENCOUNTER — Encounter: Payer: Self-pay | Admitting: Physician Assistant

## 2019-12-29 ENCOUNTER — Other Ambulatory Visit: Payer: Self-pay | Admitting: Cardiovascular Disease

## 2020-01-03 NOTE — Progress Notes (Signed)
Agree w repeat echo

## 2020-01-18 ENCOUNTER — Other Ambulatory Visit: Payer: Self-pay | Admitting: Cardiovascular Disease

## 2020-01-27 ENCOUNTER — Other Ambulatory Visit: Payer: Self-pay | Admitting: Cardiovascular Disease

## 2020-07-13 ENCOUNTER — Other Ambulatory Visit: Payer: Self-pay | Admitting: Cardiovascular Disease

## 2020-07-16 DIAGNOSIS — H2513 Age-related nuclear cataract, bilateral: Secondary | ICD-10-CM | POA: Diagnosis not present

## 2020-07-16 DIAGNOSIS — H52223 Regular astigmatism, bilateral: Secondary | ICD-10-CM | POA: Diagnosis not present

## 2020-11-26 ENCOUNTER — Ambulatory Visit (HOSPITAL_COMMUNITY): Payer: Medicare Other | Attending: Cardiovascular Disease

## 2020-11-26 ENCOUNTER — Ambulatory Visit (HOSPITAL_COMMUNITY): Payer: Medicare Other

## 2020-11-26 ENCOUNTER — Other Ambulatory Visit: Payer: Self-pay

## 2020-11-26 DIAGNOSIS — I428 Other cardiomyopathies: Secondary | ICD-10-CM | POA: Diagnosis not present

## 2020-11-26 LAB — ECHOCARDIOGRAM COMPLETE
Area-P 1/2: 1.99 cm2
S' Lateral: 3.4 cm

## 2020-12-02 DIAGNOSIS — Z0000 Encounter for general adult medical examination without abnormal findings: Secondary | ICD-10-CM | POA: Diagnosis not present

## 2020-12-02 DIAGNOSIS — Z9181 History of falling: Secondary | ICD-10-CM | POA: Diagnosis not present

## 2020-12-02 DIAGNOSIS — Z139 Encounter for screening, unspecified: Secondary | ICD-10-CM | POA: Diagnosis not present

## 2020-12-02 DIAGNOSIS — E785 Hyperlipidemia, unspecified: Secondary | ICD-10-CM | POA: Diagnosis not present

## 2020-12-20 ENCOUNTER — Telehealth: Payer: Self-pay | Admitting: Cardiovascular Disease

## 2020-12-20 NOTE — Telephone Encounter (Signed)
Patient states he will be coming with his wife to her appt with Dr. Sallyanne Kuster tomorrow but that their daughter will also be coming. He states that she always comes with them to their appointments.

## 2020-12-20 NOTE — Telephone Encounter (Signed)
Spoke to husband (ok per DPR) aware ok to come to appt.

## 2020-12-21 ENCOUNTER — Other Ambulatory Visit: Payer: Self-pay

## 2020-12-21 ENCOUNTER — Encounter: Payer: Self-pay | Admitting: Cardiovascular Disease

## 2020-12-21 ENCOUNTER — Ambulatory Visit: Payer: Medicare Other | Admitting: Cardiovascular Disease

## 2020-12-21 VITALS — BP 140/62 | HR 66 | Ht 59.0 in | Wt 127.8 lb

## 2020-12-21 DIAGNOSIS — I10 Essential (primary) hypertension: Secondary | ICD-10-CM | POA: Diagnosis not present

## 2020-12-21 DIAGNOSIS — E7800 Pure hypercholesterolemia, unspecified: Secondary | ICD-10-CM | POA: Diagnosis not present

## 2020-12-21 DIAGNOSIS — I447 Left bundle-branch block, unspecified: Secondary | ICD-10-CM | POA: Diagnosis not present

## 2020-12-21 DIAGNOSIS — F0390 Unspecified dementia without behavioral disturbance: Secondary | ICD-10-CM

## 2020-12-21 DIAGNOSIS — I5042 Chronic combined systolic (congestive) and diastolic (congestive) heart failure: Secondary | ICD-10-CM | POA: Diagnosis not present

## 2020-12-21 DIAGNOSIS — I2510 Atherosclerotic heart disease of native coronary artery without angina pectoris: Secondary | ICD-10-CM

## 2020-12-21 MED ORDER — SACUBITRIL-VALSARTAN 49-51 MG PO TABS: 1.0000 | ORAL_TABLET | Freq: Two times a day (BID) | ORAL | 1 refills | Status: DC

## 2020-12-21 NOTE — Progress Notes (Signed)
Cardiology Office Note    Date:  12/23/2020   ID:  Dominique Hernandez, Dominique Hernandez 06/16/1941, MRN [id number]  PCP:  Philmore Pali, NP  Cardiologist:  Sanda Klein, MD   Chief Complaint  Patient presents with  . Congestive Heart Failure    History of Present Illness:  Dominique Hernandez is a 80 y.o. female with moderate nonischemic cardiomyopathy but very well compensated congestive heart failure, returning for follow-up. When initially diagnosed in early 2014 LVEF was 15%, no significant CAD. After institution of medical therapy, LVEF increased to 35-40%, stable on echos in 2014 and 2018, improved to 40-45% on echo performed 11/26/2020.  She has long-standing left bundle branch block.   Her memory and cognitive defects are worsening.  Her speech is getting less and less intelligible.  Nevertheless she remains alert, engaged and very social.  She has no cardiovascular complaints.  The patient specifically denies any chest pain at rest exertion, dyspnea at rest or with exertion, orthopnea, paroxysmal nocturnal dyspnea, syncope, palpitations, focal neurological deficits, intermittent claudication, lower extremity edema, unexplained weight gain, cough, hemoptysis or wheezing.  Accompanied as always by both her son and her daughter.  Past Medical History:  Diagnosis Date  . LBBB (left bundle branch block)   . Lung nodule    RLL  . Nonischemic cardiomyopathy [facility])     Past Surgical History:  Procedure Laterality Date  . ABDOMINAL HYSTERECTOMY    . BLADDER SURGERY    . BREAST SURGERY     breast implants  . LEFT AND RIGHT HEART CATHETERIZATION WITH CORONARY ANGIOGRAM N/A 10/08/2012   Procedure: LEFT AND RIGHT HEART CATHETERIZATION WITH CORONARY ANGIOGRAM;  Surgeon: Troy Sine, MD;  Location: [facility] CATH LAB;  Service: Cardiovascular;  Laterality: N/A;  . RIGHT & LEFT HEART CATH  10/08/2012   20% LAD,catheter induced spasm RCA,EF 15-20%    Current Medications: Outpatient Medications Prior  to Visit  Medication Sig Dispense Refill  . aspirin EC 81 MG EC tablet Take 1 tablet (81 mg total) by mouth daily.    . carvedilol (COREG) 12.5 MG tablet TAKE 1 TABLET BY MOUTH TWICE A DAY NEED APPT FOR REFILLS 60 tablet 11  . citalopram (CELEXA) 10 MG tablet Take 10 mg by mouth daily.    Marland Kitchen donepezil (ARICEPT) 23 MG TABS tablet Take 1 tablet by mouth daily.  0  . memantine (NAMENDA) 10 MG tablet Take 10 mg by mouth 2 (two) times daily.  1  . Omega-3 Fatty Acids (FISH OIL PO) Take 1 capsule by mouth daily.    . pravastatin (PRAVACHOL) 40 MG tablet Take 1 tablet (40 mg total) by mouth daily. 90 tablet 3  . enalapril (VASOTEC) 20 MG tablet Take 1 tablet (20 mg total) by mouth 2 (two) times daily. TAKE 1 TABLET BY MOUTH TWICE A DAY 180 tablet 1   No facility-administered medications prior to visit.     Allergies:   Patient has no known allergies.   Social History   Socioeconomic History  . Marital status: Married    Spouse name: Not on file  . Number of children: Not on file  . Years of education: Not on file  . Highest education level: Not on file  Occupational History  . Not on file  Tobacco Use  . Smoking status: Former Smoker    Quit date: 10/07/2003    Years since quitting: 17.2  . Smokeless tobacco: Never Used  Substance and Sexual Activity  . Alcohol  use: No  . Drug use: No  . Sexual activity: Yes  Other Topics Concern  . Not on file  Social History Narrative  . Not on file   Social Determinants of Health   Financial Resource Strain: Not on file  Food Insecurity: Not on file  Transportation Needs: Not on file  Physical Activity: Not on file  Stress: Not on file  Social Connections: Not on file     Family History:  The patient's family history includes CAD in her brother, mother, sister, sister, sister, and sister; Dementia in her mother; Other (age of onset: 51) in her father.   ROS:   Please see the history of present illness.    ROS all other systems are  reviewed and are negative   PHYSICAL EXAM:   VS:  BP 140/62   Pulse 66   Ht 4\' 11"  (1.499 m)   Wt 127 lb 12.8 oz (58 kg)   SpO2 92%   BMI 25.81 kg/m      General: Alert, oriented x3, no distress, comfortable Head: no evidence of trauma, PERRL, EOMI, no exophtalmos or lid lag, no myxedema, no xanthelasma; normal ears, nose and oropharynx Neck: normal jugular venous pulsations and no hepatojugular reflux; brisk carotid pulses without delay and no carotid bruits Chest: clear to auscultation, no signs of consolidation by percussion or palpation, normal fremitus, symmetrical and full respiratory excursions Cardiovascular: normal position and quality of the apical impulse, regular rhythm, normal first and paradoxically split second heart sounds, no murmurs, rubs or gallops Abdomen: no tenderness or distention, no masses by palpation, no abnormal pulsatility or arterial bruits, normal bowel sounds, no hepatosplenomegaly Extremities: no clubbing, cyanosis or edema; 2+ radial, ulnar and brachial pulses bilaterally; 2+ right femoral, posterior tibial and dorsalis pedis pulses; 2+ left femoral, posterior tibial and dorsalis pedis pulses; no subclavian or femoral bruits Neurological: grossly nonfocal Psych: Normal mood and affect     Wt Readings from Last 3 Encounters:  12/21/20 127 lb 12.8 oz (58 kg)  12/26/19 133 lb 9.6 oz (60.6 kg)  10/04/18 137 lb (62.1 kg)      Studies/Labs Reviewed:   ECHO 11/26/2020:  1. Left ventricular ejection fraction, by estimation, is 45 to 50%. Left  ventricular ejection fraction by 3D volume is 49 %. The left ventricle has  mildly decreased function. The left ventricle has no regional wall motion  abnormalities. Left ventricular  diastolic parameters are consistent with Grade I diastolic dysfunction  (impaired relaxation).  2. Right ventricular systolic function is normal. The right ventricular  size is normal. There is normal pulmonary artery  systolic pressure. The  estimated right ventricular systolic pressure is 21.3 mmHg.  3. The mitral valve is normal in structure. Trivial mitral valve  regurgitation. No evidence of mitral stenosis.  4. The aortic valve is normal in structure. Aortic valve regurgitation is  not visualized. No aortic stenosis is present.  5. The inferior vena cava is normal in size with greater than 50%  respiratory variability, suggesting right atrial pressure of 3 mmHg.   Comparison(s): Prior images unable to be directly viewed, comparison made  by report only. The left ventricular function has improved.   EKG:  EKG is ordered today. NSR, LAA, LBBB w LAD - personally reviewed  Recent Labs: 06/11/2020 Cholesterol 167, HDL 57, LDL 90, TG 112 Creat 0.89, K 4.0  ASSESSMENT:    1. Chronic combined systolic and diastolic CHF (congestive heart failure) ([place])   2. Essential hypertension  3. LBBB (left bundle branch block)   4. Hypercholesterolemia   5. Dementia without behavioral disturbance, unspecified dementia type ([place])      PLAN:  In order of problems listed above:  1. CHF: Euvolemic, NYHA class I on appropriate meds, but may benefit further from transition to [place]. Discussed the 36 hour washout for the ACEi. Bring back for labs and titration in a few weeks. 2. HTN: Usually lower than today. 121/60 on 12/02/2020. 3. LBBB: Option for CRT-P if her heart failure symptoms worsen. This does not appear justified at this time.  4. HLP: LDL <100 (nonischemic CMP). 5. Dementia: Steady deterioration.   Medication Adjustments/Labs and Tests Ordered: Current medicines are reviewed at length with the patient today.  Concerns regarding medicines are outlined above.  Medication changes, Labs and Tests ordered today are listed in the Patient Instructions below. Patient Instructions  Medication Instructions:  STOP the Enalapril  START the Entresto 49-51 mg twice daily. Start 36 hours after stopping  the Enalapril.   *If you need a refill on your cardiac medications before your next appointment, please call your pharmacy*   Lab Work: None ordered If you have labs (blood work) drawn today and your tests are completely normal, you will receive your results only by: Marland Kitchen MyChart Message (if you have MyChart) OR . A paper copy in the mail If you have any lab test that is abnormal or we need to change your treatment, we will call you to review the results.   Testing/Procedures: None ordered   Follow-Up: At [facility], you and your health needs are our priority.  As part of our continuing mission to provide you with exceptional heart care, we have created designated Provider Care Teams.  These Care Teams include your primary Cardiologist (physician) and Advanced Practice Providers (APPs -  Physician Assistants and Nurse Practitioners) who all work together to provide you with the care you need, when you need it.  We recommend signing up for the patient portal called "MyChart".  Sign up information is provided on this After Visit Summary.  MyChart is used to connect with patients for Virtual Visits (Telemedicine).  Patients are able to view lab/test results, encounter notes, upcoming appointments, etc.  Non-urgent messages can be sent to your provider as well.   To learn more about what you can do with MyChart, go to [email or web address].    Your next appointment:   Follow up in one month with pharmD for medication titration. Follow up in 6 months with Dr. Sallyanne Kuster

## 2020-12-21 NOTE — Patient Instructions (Addendum)
Medication Instructions:  STOP the Enalapril  START the Entresto 49-51 mg twice daily. Start 36 hours after stopping the Enalapril.   *If you need a refill on your cardiac medications before your next appointment, please call your pharmacy*   Lab Work: None ordered If you have labs (blood work) drawn today and your tests are completely normal, you will receive your results only by: Marland Kitchen MyChart Message (if you have MyChart) OR . A paper copy in the mail If you have any lab test that is abnormal or we need to change your treatment, we will call you to review the results.   Testing/Procedures: None ordered   Follow-Up: At [facility], you and your health needs are our priority.  As part of our continuing mission to provide you with exceptional heart care, we have created designated Provider Care Teams.  These Care Teams include your primary Cardiologist (physician) and Advanced Practice Providers (APPs -  Physician Assistants and Nurse Practitioners) who all work together to provide you with the care you need, when you need it.  We recommend signing up for the patient portal called "MyChart".  Sign up information is provided on this After Visit Summary.  MyChart is used to connect with patients for Virtual Visits (Telemedicine).  Patients are able to view lab/test results, encounter notes, upcoming appointments, etc.  Non-urgent messages can be sent to your provider as well.   To learn more about what you can do with MyChart, go to [email or web address].    Your next appointment:   Follow up in one month with pharmD for medication titration. Follow up in 6 months with Dr. Sallyanne Kuster

## 2020-12-23 ENCOUNTER — Encounter: Payer: Self-pay | Admitting: Cardiovascular Disease

## 2020-12-24 DIAGNOSIS — Z6823 Body mass index (BMI) 23.0-23.9, adult: Secondary | ICD-10-CM | POA: Diagnosis not present

## 2020-12-24 DIAGNOSIS — J449 Chronic obstructive pulmonary disease, unspecified: Secondary | ICD-10-CM | POA: Diagnosis not present

## 2020-12-24 DIAGNOSIS — I509 Heart failure, unspecified: Secondary | ICD-10-CM | POA: Diagnosis not present

## 2020-12-24 DIAGNOSIS — E7800 Pure hypercholesterolemia, unspecified: Secondary | ICD-10-CM | POA: Diagnosis not present

## 2020-12-24 DIAGNOSIS — I447 Left bundle-branch block, unspecified: Secondary | ICD-10-CM | POA: Diagnosis not present

## 2021-01-04 ENCOUNTER — Other Ambulatory Visit: Payer: Self-pay | Admitting: Cardiovascular Disease

## 2021-01-08 ENCOUNTER — Other Ambulatory Visit: Payer: Self-pay | Admitting: Cardiovascular Disease

## 2021-01-20 ENCOUNTER — Ambulatory Visit (INDEPENDENT_AMBULATORY_CARE_PROVIDER_SITE_OTHER): Payer: Medicare Other | Admitting: Pharmacist Clinician (PhC)/ Clinical Pharmacy Specialist

## 2021-01-20 ENCOUNTER — Other Ambulatory Visit: Payer: Self-pay

## 2021-01-20 ENCOUNTER — Other Ambulatory Visit: Payer: Self-pay | Admitting: *Deleted

## 2021-01-20 DIAGNOSIS — I428 Other cardiomyopathies: Secondary | ICD-10-CM | POA: Diagnosis not present

## 2021-01-20 DIAGNOSIS — I5042 Chronic combined systolic (congestive) and diastolic (congestive) heart failure: Secondary | ICD-10-CM | POA: Diagnosis not present

## 2021-01-20 NOTE — Patient Instructions (Signed)
If you have any questions or concerns, or see her blood pressure increase consistently, please give me a call Erasmo Downer at [phone]  Check your blood pressure at home several days each week and keep record of the readings.  Take your meds as follows:  Continue with all current medications  Bring all of your meds, your BP cuff and your record of home blood pressures to your next appointment.  Exercise as you're able, try to walk approximately 30 minutes per day.  Keep salt intake to a minimum, especially watch canned and prepared boxed foods.  Eat more fresh fruits and vegetables and fewer canned items.  Avoid eating in fast food restaurants.    HOW TO TAKE YOUR BLOOD PRESSURE: Rest 5 minutes before taking your blood pressure.  Don't smoke or drink caffeinated beverages for at least 30 minutes before. Take your blood pressure before (not after) you eat. Sit comfortably with your back supported and both feet on the floor (don't cross your legs). Elevate your arm to heart level on a table or a desk. Use the proper sized cuff. It should fit smoothly and snugly around your bare upper arm. There should be enough room to slip a fingertip under the cuff. The bottom edge of the cuff should be 1 inch above the crease of the elbow. Ideally, take 3 measurements at one sitting and record the average.

## 2021-01-20 NOTE — Progress Notes (Signed)
Order placed

## 2021-01-20 NOTE — Progress Notes (Signed)
01/24/2021 [place] [birth year] [id number]   HPI:  Dominique Hernandez is a 80 y.o. female patient of Dr Sallyanne Kuster, with a PMH below who presents today for heart failure medication titration.  HFrEF was first diagnosed in 2014, with an EF at 15%.  With medical therapy her EF has improved to be stable at 40-45%.  She was seen by Dr. Sallyanne Kuster last month and lisinopril was switched to [facility] after a 36 hour washout.  She was asked to return today for both lab work and medication titration.  She is here today with her significant other.   Ms. Gaspari has dementia and her speech is unintelligible.  All information is from her significant other, although the patient does interrupt him from time to time to "correct" information.  He states she has been doing very well since starting the [place].  Family feels that she is more energetic and her blood pressures have stabilized.  They are very happy with how she is doing at the moment.    Past Medical History: hypertension Still elevated, on Entresto, carvedilol  hyperlipidemia LDL 97 on pravastatin 40  dementia Steady deterioration, on memantine and donepezil  LBBB      Blood Pressure Goal:  130/80  Current Medications: Entresto 49/51 mg bid, carvedilol 12.5 mg bid  Family Hx: mother with CAD and dementia; 4 sisters, 1 brother, all with noted CAD  Social Hx: former smoker, quit 2005; no alcohol  Home BP readings: checked regularly by family, no readings with them today - states all home reading between 956-213 systolic  Intolerances: nkda  Labs: no current labs, BMET drawn today   Wt Readings from Last 3 Encounters:  01/20/21 125 lb 12.8 oz (57.1 kg)  12/21/20 127 lb 12.8 oz (58 kg)  12/26/19 133 lb 9.6 oz (60.6 kg)   BP Readings from Last 3 Encounters:  01/20/21 134/78  12/21/20 140/62  12/26/19 (!) 150/70   Pulse Readings from Last 3 Encounters:  01/20/21 72  12/21/20 66  12/26/19 (!) 57    Current  Outpatient Medications  Medication Sig Dispense Refill   aspirin EC 81 MG EC tablet Take 1 tablet (81 mg total) by mouth daily.     betamethasone dipropionate 0.05 % cream Apply 1 drop topically 2 (two) times daily.     carvedilol (COREG) 12.5 MG tablet TAKE 1 TABLET BY MOUTH TWICE A DAY 180 tablet 3   donepezil (ARICEPT) 23 MG TABS tablet Take 1 tablet by mouth daily.  0   enalapril (VASOTEC) 20 MG tablet Take 1 tablet (20 mg total) by mouth 2 (two) times daily. 60 tablet 1   memantine (NAMENDA) 10 MG tablet Take 10 mg by mouth 2 (two) times daily.  1   Omega-3 Fatty Acids (FISH OIL PO) Take 1 capsule by mouth daily.     pravastatin (PRAVACHOL) 40 MG tablet Take 1 tablet (40 mg total) by mouth daily. 90 tablet 3   QUEtiapine (SEROQUEL) 25 MG tablet Take 25 mg by mouth at bedtime.     No current facility-administered medications for this visit.    No Known Allergies  Past Medical History:  Diagnosis Date   LBBB (left bundle branch block)    Lung nodule    RLL   Nonischemic cardiomyopathy (HCC)     Blood pressure 134/78, pulse 72, resp. rate 15, height 5\' 2"  (1.575 m), weight 125 lb 12.8 oz (57.1 kg), SpO2 93 %.  Chronic combined systolic (  congestive) and diastolic (congestive) heart failure ([place]) Patient with heart failure, recently started on Entresto.  Family is quite happy with how she is doing and are not willing to increase dose of either Entresto or carvedilol at this time.  Reviewed GDMT, including spironolactone and SGLT-2 inhibitor, explained that we titrate dosing based on blood pressure.  Still have room to increase doses based on blood pressure, but family chooses not to. .  Family not interested in starting either spironolactone or Jardiance/Farxiga either.     Tommy Medal PharmD CPP [facility] [address] [place], [place] [address] [phone]    01/21/2021: Patient's daughter called. Mother is not tolerating Entresto.  Medication is causing increased agitation and restlessness. Will like to go back to enalapril 20mg .  Prescriptions updated and instructed to start Enalapril tonight (last dose of Entresto was yesterday morning.  Raquel Rodriguez-Guzman PharmD, BCPS, [place] [address] [place] [address] 01/24/2021 7:30 AM

## 2021-01-20 NOTE — Assessment & Plan Note (Signed)
Patient with heart failure, recently started on Entresto.  Family is quite happy with how she is doing and are not willing to increase dose of either Entresto or carvedilol at this time.  Reviewed GDMT, including spironolactone and SGLT-2 inhibitor, explained that we titrate dosing based on blood pressure.  Still have room to increase doses based on blood pressure, but family chooses not to. .  Family not interested in starting either spironolactone or Jardiance/Farxiga either.

## 2021-01-21 LAB — BASIC METABOLIC PANEL
BUN/Creatinine Ratio: 27 (ref 12–28)
BUN: 22 mg/dL (ref 8–27)
CO2: 26 mmol/L (ref 20–29)
Calcium: 9.1 mg/dL (ref 8.7–10.3)
Chloride: 104 mmol/L (ref 96–106)
Creatinine, Ser: 0.83 mg/dL (ref 0.57–1.00)
Glucose: 90 mg/dL (ref 65–99)
Potassium: 3.9 mmol/L (ref 3.5–5.2)
Sodium: 145 mmol/L — ABNORMAL HIGH (ref 134–144)
eGFR: 71 mL/min/{1.73_m2} (ref >59)

## 2021-01-21 MED ORDER — ENALAPRIL MALEATE 20 MG PO TABS: 20.0000 mg | ORAL_TABLET | Freq: Two times a day (BID) | ORAL | 1 refills | Status: DC

## 2021-02-03 DIAGNOSIS — Z6823 Body mass index (BMI) 23.0-23.9, adult: Secondary | ICD-10-CM | POA: Diagnosis not present

## 2021-02-03 DIAGNOSIS — M545 Low back pain, unspecified: Secondary | ICD-10-CM | POA: Diagnosis not present

## 2021-02-14 ENCOUNTER — Other Ambulatory Visit: Payer: Self-pay | Admitting: Cardiovascular Disease

## 2021-02-24 ENCOUNTER — Ambulatory Visit
Admission: RE | Admit: 2021-02-24 | Discharge: 2021-02-24 | Disposition: A | Discharge status: Home / Self Care | Payer: Medicare Other | Source: Ambulatory Visit | Attending: Physician Assistant | Admitting: Physician Assistant

## 2021-02-24 ENCOUNTER — Other Ambulatory Visit: Payer: Self-pay

## 2021-02-24 ENCOUNTER — Other Ambulatory Visit: Payer: Self-pay | Admitting: Physician Assistant

## 2021-02-24 DIAGNOSIS — M545 Low back pain, unspecified: Secondary | ICD-10-CM

## 2021-05-13 ENCOUNTER — Other Ambulatory Visit: Payer: Self-pay

## 2021-05-13 MED ORDER — ENALAPRIL MALEATE 20 MG PO TABS: 20.0000 mg | ORAL_TABLET | Freq: Two times a day (BID) | ORAL | 1 refills | Status: DC

## 2021-06-06 ENCOUNTER — Other Ambulatory Visit: Payer: Self-pay | Admitting: Cardiovascular Disease

## 2021-07-05 DIAGNOSIS — J449 Chronic obstructive pulmonary disease, unspecified: Secondary | ICD-10-CM | POA: Diagnosis not present

## 2021-07-05 DIAGNOSIS — Z79899 Other long term (current) drug therapy: Secondary | ICD-10-CM | POA: Diagnosis not present

## 2021-07-05 DIAGNOSIS — E7800 Pure hypercholesterolemia, unspecified: Secondary | ICD-10-CM | POA: Diagnosis not present

## 2021-07-05 DIAGNOSIS — Z6823 Body mass index (BMI) 23.0-23.9, adult: Secondary | ICD-10-CM | POA: Diagnosis not present

## 2021-07-05 DIAGNOSIS — I509 Heart failure, unspecified: Secondary | ICD-10-CM | POA: Diagnosis not present

## 2021-07-05 DIAGNOSIS — F03911 Unspecified dementia, unspecified severity, with agitation: Secondary | ICD-10-CM | POA: Diagnosis not present

## 2021-07-13 ENCOUNTER — Ambulatory Visit: Payer: Medicare Other | Admitting: Cardiovascular Disease

## 2021-09-10 DIAGNOSIS — H1133 Conjunctival hemorrhage, bilateral: Secondary | ICD-10-CM | POA: Diagnosis not present

## 2021-09-22 DIAGNOSIS — H2513 Age-related nuclear cataract, bilateral: Secondary | ICD-10-CM | POA: Diagnosis not present

## 2021-09-22 DIAGNOSIS — H5203 Hypermetropia, bilateral: Secondary | ICD-10-CM | POA: Diagnosis not present

## 2021-10-26 ENCOUNTER — Other Ambulatory Visit: Payer: Self-pay | Admitting: Cardiovascular Disease

## 2021-11-04 ENCOUNTER — Encounter: Payer: Self-pay | Admitting: Cardiovascular Disease

## 2021-11-04 ENCOUNTER — Ambulatory Visit: Payer: Medicare Other | Admitting: Cardiovascular Disease

## 2021-11-04 VITALS — BP 100/67 | HR 56 | Ht 60.0 in | Wt 129.6 lb

## 2021-11-04 DIAGNOSIS — I447 Left bundle-branch block, unspecified: Secondary | ICD-10-CM | POA: Diagnosis not present

## 2021-11-04 DIAGNOSIS — I10 Essential (primary) hypertension: Secondary | ICD-10-CM | POA: Diagnosis not present

## 2021-11-04 DIAGNOSIS — I5042 Chronic combined systolic (congestive) and diastolic (congestive) heart failure: Secondary | ICD-10-CM

## 2021-11-04 DIAGNOSIS — E7800 Pure hypercholesterolemia, unspecified: Secondary | ICD-10-CM | POA: Diagnosis not present

## 2021-11-04 DIAGNOSIS — R4189 Other symptoms and signs involving cognitive functions and awareness: Secondary | ICD-10-CM

## 2021-11-04 NOTE — Patient Instructions (Signed)

## 2021-11-04 NOTE — Progress Notes (Signed)
? ?Cardiology Office Note   ? ?Date:  11/04/2021  ? ?[name], DOB 09/11/1940, MRN [id number] ? ?PCP:  Philmore Pali, NP  ?Cardiologist:  Sanda Klein, MD  ? ?Chief Complaint  ?Patient presents with  ? Congestive Heart Failure  ?   ?  ? ? ?History of Present Illness:  ?Dominique Hernandez is a 81 y.o. female with moderate nonischemic cardiomyopathy but very well compensated congestive heart failure, returning for follow-up. When initially diagnosed in early 2014 LVEF was 15%, no significant CAD. After institution of medical therapy, LVEF increased to 35-40%, stable on echos in 2014 and 2018, improved to 40-45% on echo performed 11/26/2020.  She has long-standing left bundle branch block.  ? ?Her speech is even less intelligible as her cognitive deficits are worsening.  Nevertheless she seems to be able to understand conversation and is alert and engaged.  Her husband helps with the review of systems.  He takes great care of her and even takes care of doing her hair and putting on her make-up. ? ?She has no cardiovascular complaints. The patient specifically denies any chest pain at rest exertion, dyspnea at rest or with exertion, orthopnea, paroxysmal nocturnal dyspnea, syncope, palpitations, focal neurological deficits, intermittent claudication, lower extremity edema, unexplained weight gain, cough, hemoptysis or wheezing.  ? ?Her memory and cognitive defects are worsening.  Her speech is getting less and less intelligible.  Nevertheless she remains alert, engaged and very social.  She has no cardiovascular complaints. ? ? ? ?Past Medical History:  ?Diagnosis Date  ? LBBB (left bundle branch block)   ? Lung nodule   ? RLL  ? Nonischemic cardiomyopathy ([place])   ? ? ?Past Surgical History:  ?Procedure Laterality Date  ? ABDOMINAL HYSTERECTOMY    ? BLADDER SURGERY    ? BREAST SURGERY    ? breast implants  ? LEFT AND RIGHT HEART CATHETERIZATION WITH CORONARY ANGIOGRAM N/A 10/08/2012  ? Procedure: LEFT AND  RIGHT HEART CATHETERIZATION WITH CORONARY ANGIOGRAM;  Surgeon: Troy Sine, MD;  Location: [facility] CATH LAB;  Service: Cardiovascular;  Laterality: N/A;  ? RIGHT & LEFT HEART CATH  10/08/2012  ? 20% LAD,catheter induced spasm RCA,EF 15-20%  ? ? ?Current Medications: ?Outpatient Medications Prior to Visit  ?Medication Sig Dispense Refill  ? aspirin EC 81 MG EC tablet Take 1 tablet (81 mg total) by mouth daily.    ? betamethasone dipropionate 0.05 % cream Apply 1 drop topically 2 (two) times daily.    ? carvedilol (COREG) 12.5 MG tablet TAKE 1 TABLET BY MOUTH TWICE A DAY 180 tablet 3  ? donepezil (ARICEPT) 23 MG TABS tablet Take 1 tablet by mouth daily.  0  ? enalapril (VASOTEC) 20 MG tablet TAKE 1 TABLET BY MOUTH TWICE A DAY 180 tablet 3  ? memantine (NAMENDA) 10 MG tablet Take 10 mg by mouth 2 (two) times daily.  1  ? Omega-3 Fatty Acids (FISH OIL PO) Take 1 capsule by mouth daily.    ? pravastatin (PRAVACHOL) 40 MG tablet Take 1 tablet (40 mg total) by mouth daily. 90 tablet 3  ? QUEtiapine (SEROQUEL) 25 MG tablet Take 25 mg by mouth at bedtime.    ? ?No facility-administered medications prior to visit.  ?  ? ?Allergies:   Patient has no known allergies.  ? ?Social History  ? ?Socioeconomic History  ? Marital status: Married  ?  Spouse name: Not on file  ? Number of children: Not on  file  ? Years of education: Not on file  ? Highest education level: Not on file  ?Occupational History  ? Not on file  ?Tobacco Use  ? Smoking status: Former  ?  Types: Cigarettes  ?  Quit date: 10/07/2003  ?  Years since quitting: 18.0  ? Smokeless tobacco: Never  ?Substance and Sexual Activity  ? Alcohol use: No  ? Drug use: No  ? Sexual activity: Yes  ?Other Topics Concern  ? Not on file  ?Social History Narrative  ? Not on file  ? ?Social Determinants of Health  ? ?Financial Resource Strain: Not on file  ?Food Insecurity: Not on file  ?Transportation Needs: Not on file  ?Physical Activity: Not on file  ?Stress: Not on file  ?Social  Connections: Not on file  ?  ? ?Family History:  The patient's family history includes CAD in her brother, mother, sister, sister, sister, and sister; Dementia in her mother; Other (age of onset: 81) in her father.  ? ?ROS:   ?Please see the history of present illness.    ?ROS all other systems are reviewed and are negative ? ? ?PHYSICAL EXAM:   ?VS:  BP 100/67   Pulse (!) 56   Ht 5' (1.524 m)   Wt 129 lb 9.6 oz (58.8 kg)   SpO2 92%   BMI 25.31 kg/m?    ? ? ?General: Alert, oriented x3, no distress,  ?Head: no evidence of trauma, PERRL, EOMI, no exophtalmos or lid lag, no myxedema, no xanthelasma; normal ears, nose and oropharynx ?Neck: normal jugular venous pulsations and no hepatojugular reflux; brisk carotid pulses without delay and no carotid bruits ?Chest: clear to auscultation, no signs of consolidation by percussion or palpation, normal fremitus, symmetrical and full respiratory excursions ?Cardiovascular: normal position and quality of the apical impulse, regular rhythm, normal first and paradoxically split second heart sounds, no murmurs, rubs or gallops ?Abdomen: no tenderness or distention, no masses by palpation, no abnormal pulsatility or arterial bruits, normal bowel sounds, no hepatosplenomegaly ?Extremities: no clubbing, cyanosis or edema; 2+ radial, ulnar and brachial pulses bilaterally; 2+ right femoral, posterior tibial and dorsalis pedis pulses; 2+ left femoral, posterior tibial and dorsalis pedis pulses; no subclavian or femoral bruits ?Neurological: grossly nonfocal ?Psych: Normal mood and affect ? ? ? ? ?Wt Readings from Last 3 Encounters:  ?11/04/21 129 lb 9.6 oz (58.8 kg)  ?01/20/21 125 lb 12.8 oz (57.1 kg)  ?12/21/20 127 lb 12.8 oz (58 kg)  ?  ? ? ?Studies/Labs Reviewed:  ? ?ECHO 11/26/2020: ? ? 1. Left ventricular ejection fraction, by estimation, is 45 to 50%. Left  ?ventricular ejection fraction by 3D volume is 49 %. The left ventricle has  ?mildly decreased function. The left  ventricle has no regional wall motion  ?abnormalities. Left ventricular  ? diastolic parameters are consistent with Grade I diastolic dysfunction  ?(impaired relaxation).  ? 2. Right ventricular systolic function is normal. The right ventricular  ?size is normal. There is normal pulmonary artery systolic pressure. The  ?estimated right ventricular systolic pressure is 16.9 mmHg.  ? 3. The mitral valve is normal in structure. Trivial mitral valve  ?regurgitation. No evidence of mitral stenosis.  ? 4. The aortic valve is normal in structure. Aortic valve regurgitation is  ?not visualized. No aortic stenosis is present.  ? 5. The inferior vena cava is normal in size with greater than 50%  ?respiratory variability, suggesting right atrial pressure of 3 mmHg.  ? ?Comparison(s):  Prior images unable to be directly viewed, comparison made  ?by report only. The left ventricular function has improved.  ? ?EKG:  EKG is ordered today.  Unchanged from previous tracings, shows normal sinus rhythm, left bundle branch block with left axis deviation.  QTc 465 ms. ? ?Recent Labs: ?06/11/2020 ?Cholesterol 167, HDL 57, LDL 90, TG 112 ?Creat 0.89, K 4.0 ? ?07/05/2021 ?Cholesterol 156, HDL 52, LDL 82, triglycerides 124 ?Creatinine 0.91, potassium 3.9, ALT 12.0 ? ?ASSESSMENT:   ? ?1. Chronic combined systolic and diastolic CHF (congestive heart failure) ([place])   ?2. Essential hypertension   ?3. LBBB (left bundle branch block)   ?4. Hypercholesterolemia   ?5. Cognitive deficits   ? ? ? ?PLAN:  ?In order of problems listed above: ? ?CHF: Euvolemic, NYHA functional class I.  Remains on enalapril and carvedilol.  We attempted transition to [facility] but this appeared to cause agitation and restlessness and she is now back on enalapril.   ?HTN: Borderline low.  Usually a little higher than this at home.  Denies symptoms of hypotension. ?LBBB: Relatively narrow QRS complex.  Functional status is good and CRT therapy does not appear  indicated. ?HLP: LDL at target <100 (nonischemic CMP). ?Dementia: Continues to exhibit steady deterioration. ? ? ?Medication Adjustments/Labs and Tests Ordered: ?Current medicines are reviewed at length with the patient today.  Con

## 2021-12-05 DIAGNOSIS — Z0000 Encounter for general adult medical examination without abnormal findings: Secondary | ICD-10-CM | POA: Diagnosis not present

## 2021-12-05 DIAGNOSIS — Z139 Encounter for screening, unspecified: Secondary | ICD-10-CM | POA: Diagnosis not present

## 2021-12-05 DIAGNOSIS — Z9181 History of falling: Secondary | ICD-10-CM | POA: Diagnosis not present

## 2021-12-05 DIAGNOSIS — E785 Hyperlipidemia, unspecified: Secondary | ICD-10-CM | POA: Diagnosis not present

## 2022-01-03 DIAGNOSIS — Z79899 Other long term (current) drug therapy: Secondary | ICD-10-CM | POA: Diagnosis not present

## 2022-01-03 DIAGNOSIS — F03911 Unspecified dementia, unspecified severity, with agitation: Secondary | ICD-10-CM | POA: Diagnosis not present

## 2022-01-03 DIAGNOSIS — I5020 Unspecified systolic (congestive) heart failure: Secondary | ICD-10-CM | POA: Diagnosis not present

## 2022-01-03 DIAGNOSIS — J449 Chronic obstructive pulmonary disease, unspecified: Secondary | ICD-10-CM | POA: Diagnosis not present

## 2022-01-03 DIAGNOSIS — E7800 Pure hypercholesterolemia, unspecified: Secondary | ICD-10-CM | POA: Diagnosis not present

## 2022-03-06 DIAGNOSIS — B078 Other viral warts: Secondary | ICD-10-CM | POA: Diagnosis not present

## 2022-03-06 DIAGNOSIS — L821 Other seborrheic keratosis: Secondary | ICD-10-CM | POA: Diagnosis not present

## 2022-03-06 DIAGNOSIS — D225 Melanocytic nevi of trunk: Secondary | ICD-10-CM | POA: Diagnosis not present

## 2022-05-22 ENCOUNTER — Telehealth: Payer: Self-pay | Admitting: Cardiovascular Disease

## 2022-05-22 NOTE — Telephone Encounter (Signed)
*  STAT* If patient is at the pharmacy, call can be transferred to refill team.   1. Which medications need to be refilled? (please list name of each medication and dose if known) enalapril (VASOTEC) 20 MG tablet  2. Which pharmacy/location (including street and city if local pharmacy) is medication to be sent to? [id number] [place] 3. Do they need a 30 day or 90 day supply? 90 day

## 2022-05-23 MED ORDER — ENALAPRIL MALEATE 20 MG PO TABS: 20.0000 mg | ORAL_TABLET | Freq: Two times a day (BID) | ORAL | 1 refills | Status: DC

## 2022-07-10 DIAGNOSIS — Z79899 Other long term (current) drug therapy: Secondary | ICD-10-CM | POA: Diagnosis not present

## 2022-07-10 DIAGNOSIS — J449 Chronic obstructive pulmonary disease, unspecified: Secondary | ICD-10-CM | POA: Diagnosis not present

## 2022-07-10 DIAGNOSIS — E7800 Pure hypercholesterolemia, unspecified: Secondary | ICD-10-CM | POA: Diagnosis not present

## 2022-07-10 DIAGNOSIS — I5020 Unspecified systolic (congestive) heart failure: Secondary | ICD-10-CM | POA: Diagnosis not present

## 2022-07-10 DIAGNOSIS — F03911 Unspecified dementia, unspecified severity, with agitation: Secondary | ICD-10-CM | POA: Diagnosis not present

## 2022-07-10 DIAGNOSIS — Z23 Encounter for immunization: Secondary | ICD-10-CM | POA: Diagnosis not present

## 2022-08-02 DIAGNOSIS — R8290 Unspecified abnormal findings in urine: Secondary | ICD-10-CM | POA: Diagnosis not present

## 2022-10-02 IMAGING — CR DG LUMBAR SPINE COMPLETE 4+V
5 series · 5 of 5 positions shown · non-contrast
Comparison: None.

CLINICAL DATA: Low back pain, right-sided pain for 4 weeks

EXAM:
LUMBAR SPINE - COMPLETE 4+ VIEW

[w lumbar spine ap]
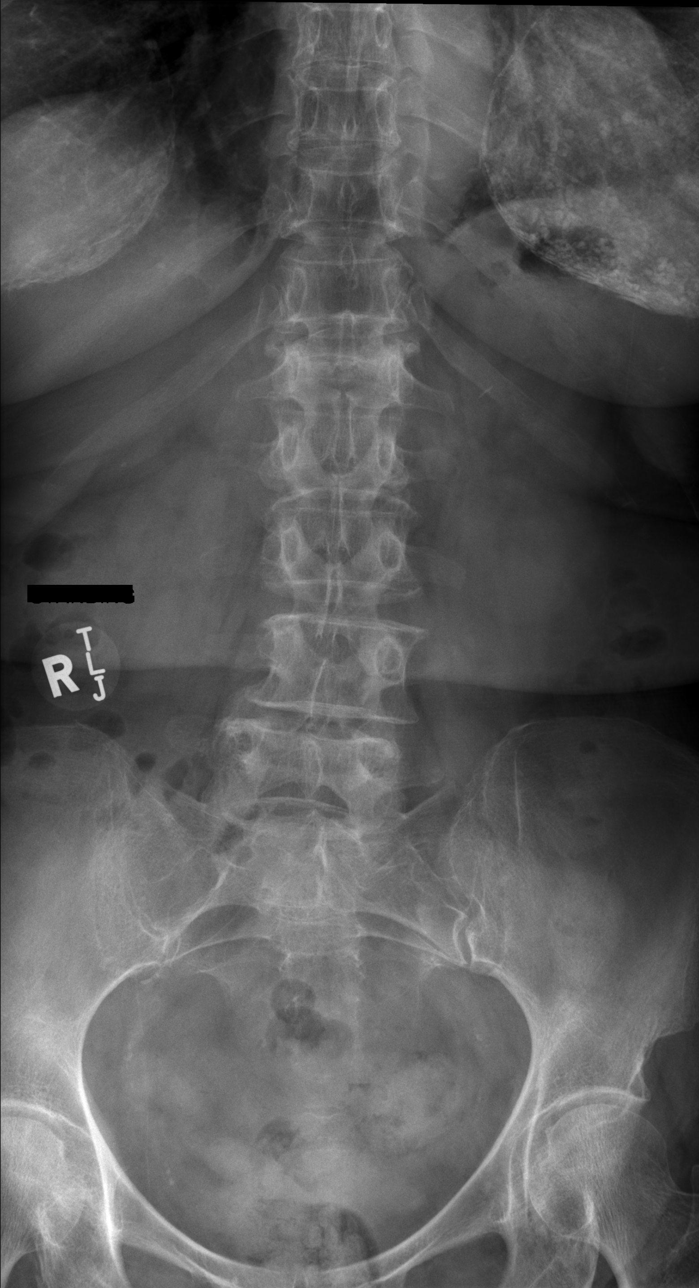

[w lumbar spine obl (1 of 2)]
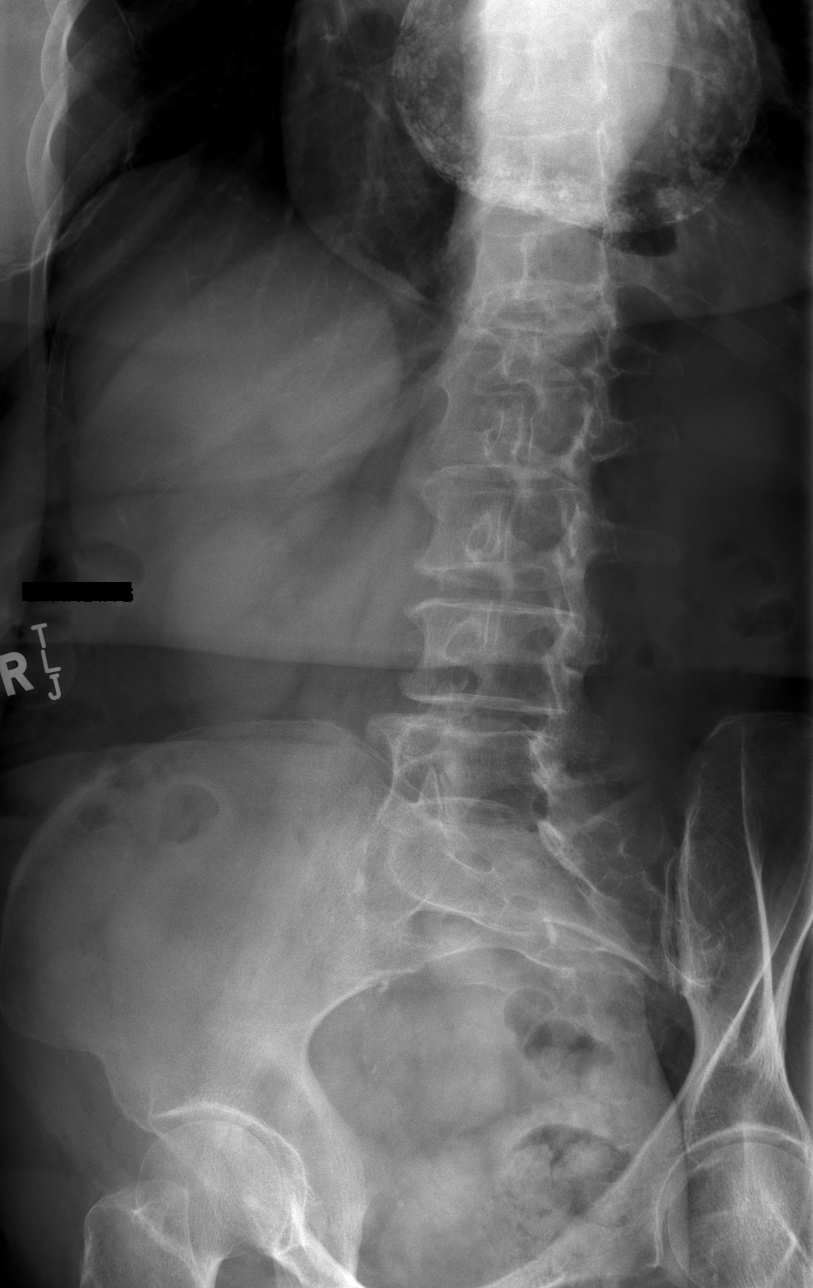

[w lumbar spine obl (2 of 2)]
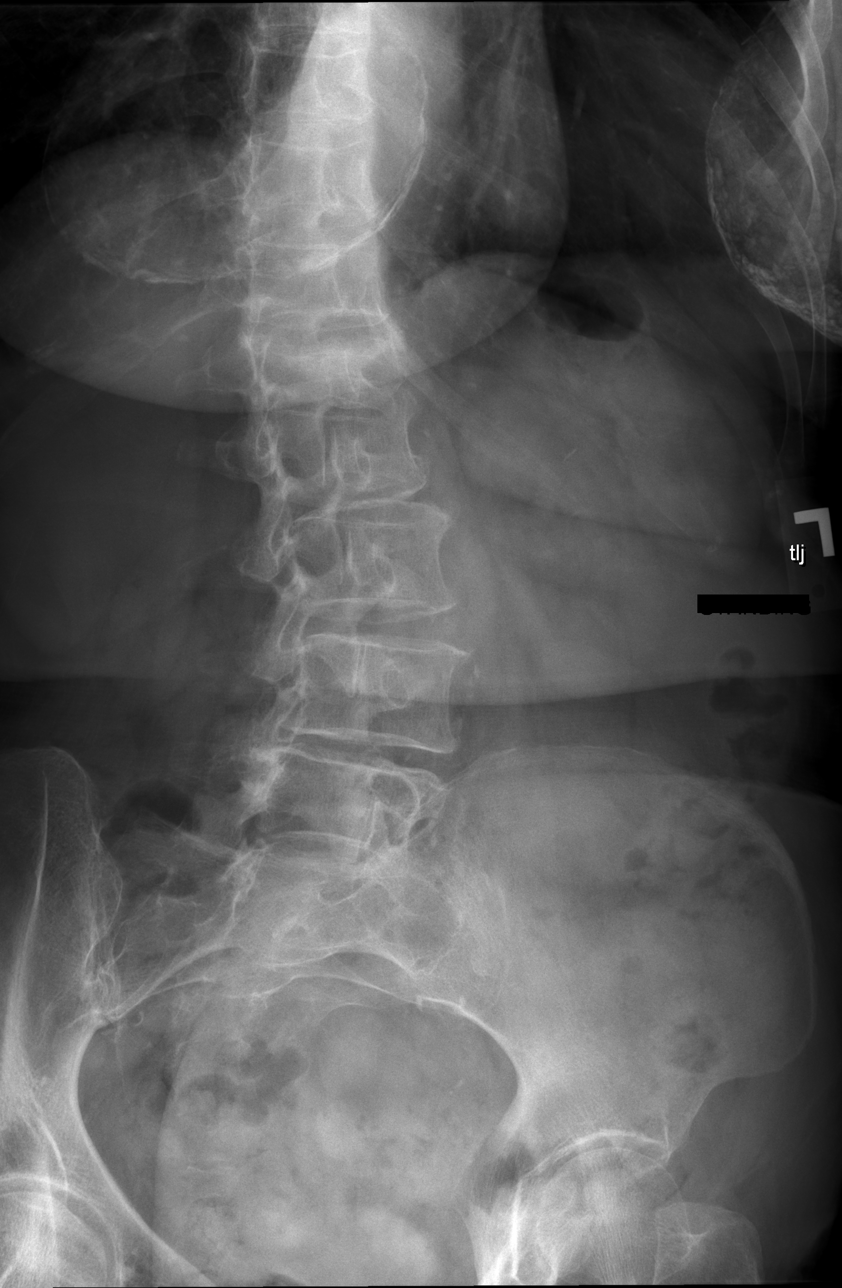

[w lumbar spine lat]
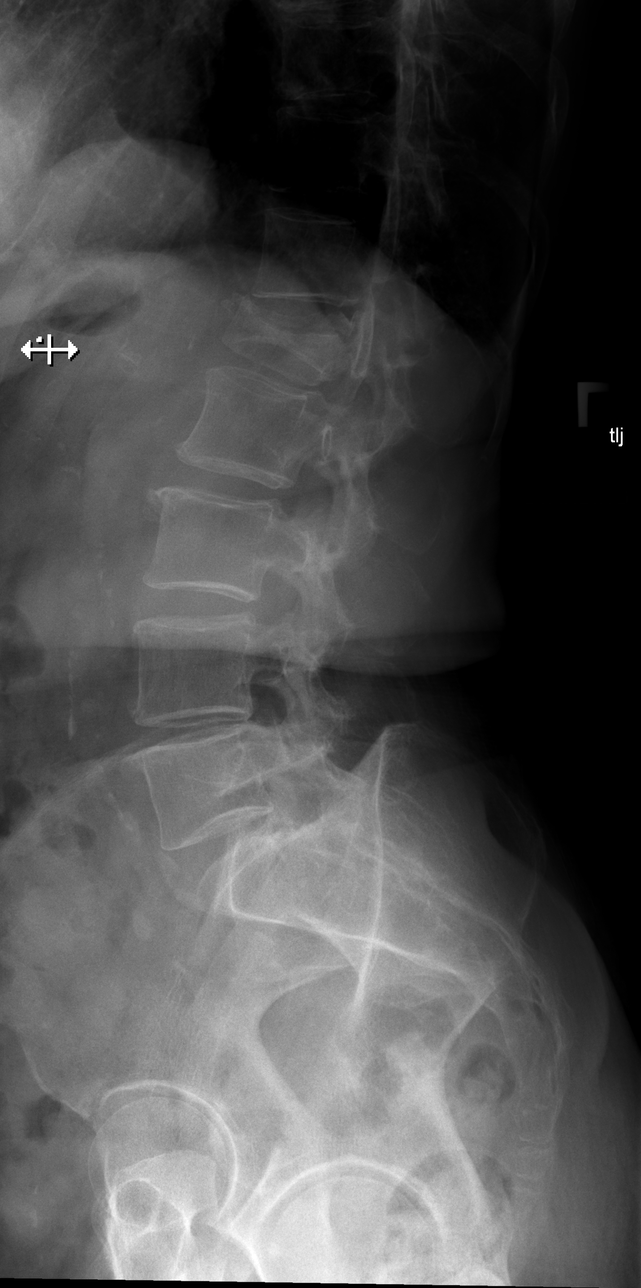

[w lumbar l-5 s-1 spot]
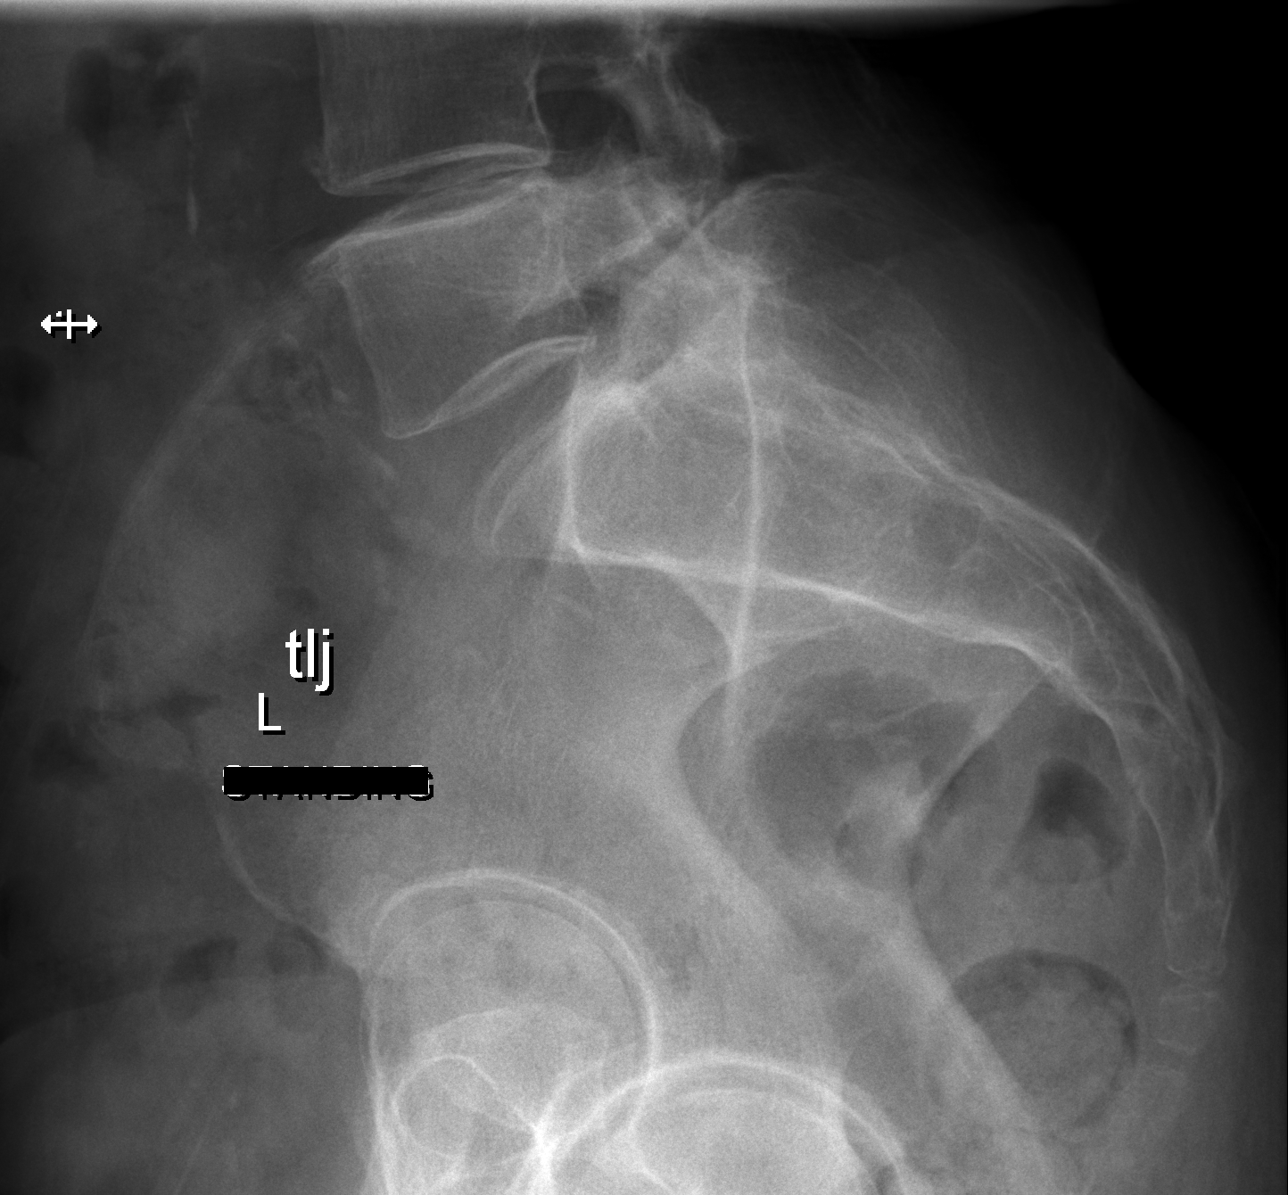

[5 of 5 positions shown; findings below may reference images not displayed]

FINDINGS: Frontal, bilateral oblique, lateral views of the lumbar spine are
obtained. There is mild left convex scoliosis centered at L4. L1
compression deformity is identified with greater than 75% loss of
height. No evidence of retropulsion. The L1 fracture is of uncertain
chronicity, if further characterization is desired MRI or bone scan
could be considered.

No other acute bony abnormalities. There is mild disc space
narrowing at L4-5, with mild facet hypertrophic changes seen at L4-5
and L5-S1. Sacroiliac joints are unremarkable.
IMPRESSION: 1. Age indeterminate L1 compression deformity, with greater than 75%
loss of height. If assessment of acuity is desired, MRI or bone scan
could be considered.
2. Mild left convex scoliosis, with mild spondylosis and facet
hypertrophy at the lumbosacral junction.

These results will be called to the ordering clinician or
representative by the Radiologist Assistant, and communication
documented in the PACS or [REDACTED].

## 2022-11-16 ENCOUNTER — Telehealth: Payer: Self-pay | Admitting: Cardiovascular Disease

## 2022-11-16 MED ORDER — ENALAPRIL MALEATE 20 MG PO TABS: 20.0000 mg | ORAL_TABLET | Freq: Two times a day (BID) | ORAL | 0 refills | Status: DC

## 2022-11-16 NOTE — Telephone Encounter (Signed)
*  STAT* If patient is at the pharmacy, call can be transferred to refill team.   1. Which medications need to be refilled? (please list name of each medication and dose if known) enalapril (VASOTEC) 20 MG tablet  2. Which pharmacy/location (including street and city if local pharmacy) is medication to be sent to? [id number] [place] [facility] [address] [facility] [address] [facility]  3. Do they need a 30 day or 90 day supply? 90 day  

## 2022-12-23 ENCOUNTER — Other Ambulatory Visit: Payer: Self-pay | Admitting: Cardiovascular Disease

## 2023-01-25 DIAGNOSIS — N390 Urinary tract infection, site not specified: Secondary | ICD-10-CM | POA: Diagnosis not present

## 2023-02-12 ENCOUNTER — Other Ambulatory Visit: Payer: Self-pay | Admitting: Cardiovascular Disease

## 2023-02-16 DIAGNOSIS — F03911 Unspecified dementia, unspecified severity, with agitation: Secondary | ICD-10-CM | POA: Diagnosis not present

## 2023-02-16 DIAGNOSIS — E7800 Pure hypercholesterolemia, unspecified: Secondary | ICD-10-CM | POA: Diagnosis not present

## 2023-02-16 DIAGNOSIS — J449 Chronic obstructive pulmonary disease, unspecified: Secondary | ICD-10-CM | POA: Diagnosis not present

## 2023-02-16 DIAGNOSIS — Z139 Encounter for screening, unspecified: Secondary | ICD-10-CM | POA: Diagnosis not present

## 2023-02-16 DIAGNOSIS — Z79899 Other long term (current) drug therapy: Secondary | ICD-10-CM | POA: Diagnosis not present

## 2023-02-16 DIAGNOSIS — Z9181 History of falling: Secondary | ICD-10-CM | POA: Diagnosis not present

## 2023-02-16 DIAGNOSIS — I5020 Unspecified systolic (congestive) heart failure: Secondary | ICD-10-CM | POA: Diagnosis not present

## 2023-02-17 LAB — LAB REPORT - SCANNED: eGFR: 77

## 2023-02-18 ENCOUNTER — Other Ambulatory Visit: Payer: Self-pay | Admitting: Cardiovascular Disease

## 2023-02-21 ENCOUNTER — Encounter: Payer: Self-pay | Admitting: Cardiovascular Disease

## 2023-02-21 ENCOUNTER — Ambulatory Visit: Payer: Medicare Other | Admitting: Cardiovascular Disease

## 2023-02-21 VITALS — BP 128/90 | HR 60 | Ht 60.0 in | Wt 127.0 lb

## 2023-02-21 DIAGNOSIS — I5042 Chronic combined systolic (congestive) and diastolic (congestive) heart failure: Secondary | ICD-10-CM

## 2023-02-21 DIAGNOSIS — E7800 Pure hypercholesterolemia, unspecified: Secondary | ICD-10-CM | POA: Diagnosis not present

## 2023-02-21 DIAGNOSIS — I10 Essential (primary) hypertension: Secondary | ICD-10-CM | POA: Diagnosis not present

## 2023-02-21 DIAGNOSIS — I428 Other cardiomyopathies: Secondary | ICD-10-CM

## 2023-02-21 DIAGNOSIS — I447 Left bundle-branch block, unspecified: Secondary | ICD-10-CM | POA: Diagnosis not present

## 2023-02-21 NOTE — Progress Notes (Signed)
Cardiology Office Note    Date:  02/21/2023   ID:  ORLENE Hernandez, DOB Sep 14, 1940, MRN [id number]  PCP:  Ronal Fear, NP (Inactive)  Cardiologist:  Thurmon Fair, MD   Chief Complaint  Patient presents with   Congestive Heart Failure    History of Present Illness:  Dominique Hernandez is a 82 y.o. female with moderate nonischemic cardiomyopathy but very well compensated congestive heart failure, returning for follow-up. When initially diagnosed in early 2014 LVEF was 15%, no significant CAD. After institution of medical therapy, LVEF increased to 35-40%, stable on echos in 2014 and 2018, improved to 45-50% on echo performed 11/26/2020.  She has long-standing left bundle branch block.   Dementia continues to progress slowly.  She cannot use any intelligible words, but seems to understand at least some of our interaction today.  She was able to follow instructions to take deep breaths.  Seems to be in a very good mood.  She is unable to walk more than a few steps between the rooms in her home, does so with assistance to avoid falls.  Does not appear to be short of breath when she walks.  Sleeps flat in bed.  Has not had edema.  Her husband helps with the review of systems since her speech is not intelligible.Marland Kitchen  He takes great care of her and even takes care of doing her hair and putting on her make-up.  Her memory and cognitive defects are worsening.  Her speech is getting less and less intelligible.  Nevertheless she remains alert, engaged and very social.  She has no cardiovascular complaints.    Past Medical History:  Diagnosis Date   LBBB (left bundle branch block)    Lung nodule    RLL   Nonischemic cardiomyopathy (HCC)     Past Surgical History:  Procedure Laterality Date   ABDOMINAL HYSTERECTOMY     BLADDER SURGERY     BREAST SURGERY     breast implants   LEFT AND RIGHT HEART CATHETERIZATION WITH CORONARY ANGIOGRAM N/A 10/08/2012   Procedure: LEFT AND RIGHT HEART  CATHETERIZATION WITH CORONARY ANGIOGRAM;  Surgeon: Lennette Bihari, MD;  Location: [facility] CATH LAB;  Service: Cardiovascular;  Laterality: N/A;   RIGHT & LEFT HEART CATH  10/08/2012   20% LAD,catheter induced spasm RCA,EF 15-20%    Current Medications: Outpatient Medications Prior to Visit  Medication Sig Dispense Refill   aspirin EC 81 MG EC tablet Take 1 tablet (81 mg total) by mouth daily.     betamethasone dipropionate 0.05 % cream Apply 1 drop topically 2 (two) times daily.     carvedilol (COREG) 12.5 MG tablet TAKE 1 TABLET BY MOUTH TWICE A DAY 180 tablet 0   donepezil (ARICEPT) 23 MG TABS tablet Take 1 tablet by mouth daily.  0   enalapril (VASOTEC) 20 MG tablet TAKE 1 TABLET (20 MG TOTAL) BY MOUTH 2 (TWO) TIMES DAILY. KEEP OV. 60 tablet 0   memantine (NAMENDA) 10 MG tablet Take 10 mg by mouth 2 (two) times daily.  1   Omega-3 Fatty Acids (FISH OIL PO) Take 1 capsule by mouth daily.     pravastatin (PRAVACHOL) 80 MG tablet Take 80 mg by mouth daily.     QUEtiapine (SEROQUEL) 25 MG tablet Take 25 mg by mouth in the morning.     QUEtiapine (SEROQUEL) 50 MG tablet Take 50 mg by mouth at bedtime.     pravastatin (PRAVACHOL) 40 MG tablet Take 1  tablet (40 mg total) by mouth daily. (Patient not taking: Reported on 02/21/2023) 90 tablet 3   No facility-administered medications prior to visit.     Allergies:   Patient has no known allergies.   Social History   Socioeconomic History   Marital status: Married    Spouse name: Not on file   Number of children: Not on file   Years of education: Not on file   Highest education level: Not on file  Occupational History   Not on file  Tobacco Use   Smoking status: Former    Current packs/day: 0.00    Types: Cigarettes    Quit date: 10/07/2003    Years since quitting: 19.3   Smokeless tobacco: Never  Substance and Sexual Activity   Alcohol use: No   Drug use: No   Sexual activity: Yes  Other Topics Concern   Not on file  Social History  Narrative   Not on file   Social Determinants of Health   Financial Resource Strain: Not on file  Food Insecurity: Not on file  Transportation Needs: Not on file  Physical Activity: Not on file  Stress: Not on file  Social Connections: Not on file     Family History:  The patient's family history includes CAD in her brother, mother, sister, sister, sister, and sister; Dementia in her mother; Other (age of onset: 10) in her father.   ROS:   Please see the history of present illness.    ROS all other systems are reviewed and are negative   PHYSICAL EXAM:   VS:  BP (!) 128/90 (BP Location: Left Arm, Patient Position: Sitting, Cuff Size: Normal)   Pulse 60   Ht 5' (1.524 m)   Wt 127 lb (57.6 kg)   SpO2 97%   BMI 24.80 kg/m      General: Alert, oriented x3, no distress,  Head: no evidence of trauma, PERRL, EOMI, no exophtalmos or lid lag, no myxedema, no xanthelasma; normal ears, nose and oropharynx Neck: normal jugular venous pulsations and no hepatojugular reflux; brisk carotid pulses without delay and no carotid bruits Chest: clear to auscultation, no signs of consolidation by percussion or palpation, normal fremitus, symmetrical and full respiratory excursions Cardiovascular: normal position and quality of the apical impulse, regular rhythm, normal first and paradoxically split second heart sounds, no murmurs, rubs or gallops Abdomen: no tenderness or distention, no masses by palpation, no abnormal pulsatility or arterial bruits, normal bowel sounds, no hepatosplenomegaly Extremities: no clubbing, cyanosis or edema; 2+ radial, ulnar and brachial pulses bilaterally; 2+ right femoral, posterior tibial and dorsalis pedis pulses; 2+ left femoral, posterior tibial and dorsalis pedis pulses; no subclavian or femoral bruits Neurological: grossly nonfocal Psych: Normal mood and affect     Wt Readings from Last 3 Encounters:  02/21/23 127 lb (57.6 kg)  11/04/21 129 lb 9.6 oz (58.8  kg)  01/20/21 125 lb 12.8 oz (57.1 kg)      Studies/Labs Reviewed:   ECHO 11/26/2020:   1. Left ventricular ejection fraction, by estimation, is 45 to 50%. Left  ventricular ejection fraction by 3D volume is 49 %. The left ventricle has  mildly decreased function. The left ventricle has no regional wall motion  abnormalities. Left ventricular   diastolic parameters are consistent with Grade I diastolic dysfunction  (impaired relaxation).   2. Right ventricular systolic function is normal. The right ventricular  size is normal. There is normal pulmonary artery systolic pressure. The  estimated right  ventricular systolic pressure is 21.8 mmHg.   3. The mitral valve is normal in structure. Trivial mitral valve  regurgitation. No evidence of mitral stenosis.   4. The aortic valve is normal in structure. Aortic valve regurgitation is  not visualized. No aortic stenosis is present.   5. The inferior vena cava is normal in size with greater than 50%  respiratory variability, suggesting right atrial pressure of 3 mmHg.   Comparison(s): Prior images unable to be directly viewed, comparison made  by report only. The left ventricular function has improved.   EKG:  EKG is ordered today.  Unchanged from previous tracing shows left bundle branch block and left axis deviation.  QRS is only 120 ms.  Recent Labs: 06/11/2020 Cholesterol 167, HDL 57, LDL 90, TG 112 Creat 0.89, K 4.0  07/05/2021 Cholesterol 156, HDL 52, LDL 82, triglycerides 124 Creatinine 0.91, potassium 3.9, ALT 12.0  ASSESSMENT:    1. Chronic combined systolic and diastolic congestive heart failure (HCC)   2. LBBB (left bundle branch block)   3. Essential hypertension   4. Nonischemic cardiomyopathy-   5. Hypercholesterolemia      PLAN:  In order of problems listed above:  CHF: Remains euvolemic without diuretics.  Very limited physical activity, so unable to assess functional class, but appears asymptomatic.   Remains on enalapril and carvedilol.  We attempted transition to [facility] but this appeared to cause agitation and restlessness and she is now back on enalapril.  There does not appear to be any reason to augment medical therapy so we will not add spironolactone or SGLT2 inhibitor. HTN: In desirable range. LBBB: Has not had evidence of high-grade AV block.  Relatively narrow QRS complex.  Functional status is good and CRT therapy does not appear indicated. HLP: Target LDL less than 100.  Dose of pravastatin was recently increased to 80 mg at bedtime daily. Dementia: Continues to exhibit steady deterioration.  Less mobile, but has not had falls.  Unable to communicate with any intelligible words.  Seems to follow most commands and understand at least some communications.   Medication Adjustments/Labs and Tests Ordered: Current medicines are reviewed at length with the patient today.  Concerns regarding medicines are outlined above.  Medication changes, Labs and Tests ordered today are listed in the Patient Instructions below. Patient Instructions  Medication Instructions:  No changes *If you need a refill on your cardiac medications before your next appointment, please call your pharmacy*  Follow-Up: At [facility], you and your health needs are our priority.  As part of our continuing mission to provide you with exceptional heart care, we have created designated Provider Care Teams.  These Care Teams include your primary Cardiologist (physician) and Advanced Practice Providers (APPs -  Physician Assistants and Nurse Practitioners) who all work together to provide you with the care you need, when you need it.  We recommend signing up for the patient portal called "MyChart".  Sign up information is provided on this After Visit Summary.  MyChart is used to connect with patients for Virtual Visits (Telemedicine).  Patients are able to view lab/test results, encounter notes, upcoming  appointments, etc.  Non-urgent messages can be sent to your provider as well.   To learn more about what you can do with MyChart, go to [email or web address].    Your next appointment:   1 year(s)  Provider:   Thurmon Fair, MD

## 2023-02-21 NOTE — Patient Instructions (Signed)

## 2023-03-06 DIAGNOSIS — N6489 Other specified disorders of breast: Secondary | ICD-10-CM | POA: Diagnosis not present

## 2023-03-07 ENCOUNTER — Other Ambulatory Visit: Payer: Self-pay | Admitting: Cardiovascular Disease

## 2023-03-12 DIAGNOSIS — N6489 Other specified disorders of breast: Secondary | ICD-10-CM | POA: Diagnosis not present

## 2023-03-14 ENCOUNTER — Other Ambulatory Visit: Payer: Self-pay | Admitting: Obstetrics and Gynecology

## 2023-03-14 DIAGNOSIS — T148XXA Other injury of unspecified body region, initial encounter: Secondary | ICD-10-CM

## 2023-03-15 ENCOUNTER — Other Ambulatory Visit: Payer: Self-pay | Admitting: Obstetrics and Gynecology

## 2023-03-15 ENCOUNTER — Other Ambulatory Visit (HOSPITAL_COMMUNITY): Payer: Self-pay | Admitting: Obstetrics and Gynecology

## 2023-03-15 DIAGNOSIS — N6489 Other specified disorders of breast: Secondary | ICD-10-CM

## 2023-03-16 ENCOUNTER — Other Ambulatory Visit (HOSPITAL_COMMUNITY): Payer: Self-pay | Admitting: Obstetrics and Gynecology

## 2023-03-16 DIAGNOSIS — N6489 Other specified disorders of breast: Secondary | ICD-10-CM

## 2023-03-20 DIAGNOSIS — R8290 Unspecified abnormal findings in urine: Secondary | ICD-10-CM | POA: Diagnosis not present

## 2023-04-22 ENCOUNTER — Other Ambulatory Visit: Payer: Medicare Other

## 2023-05-02 ENCOUNTER — Ambulatory Visit
Admission: RE | Admit: 2023-05-02 | Discharge: 2023-05-02 | Disposition: A | Discharge status: Home / Self Care | Payer: Medicare Other | Source: Ambulatory Visit | Attending: Obstetrics and Gynecology | Admitting: Obstetrics and Gynecology

## 2023-05-02 DIAGNOSIS — N6489 Other specified disorders of breast: Secondary | ICD-10-CM

## 2023-05-17 ENCOUNTER — Other Ambulatory Visit: Payer: Self-pay | Admitting: Cardiovascular Disease

## 2023-05-21 DIAGNOSIS — Z23 Encounter for immunization: Secondary | ICD-10-CM | POA: Diagnosis not present

## 2023-06-26 DIAGNOSIS — Z139 Encounter for screening, unspecified: Secondary | ICD-10-CM | POA: Diagnosis not present

## 2023-06-26 DIAGNOSIS — Z0000 Encounter for general adult medical examination without abnormal findings: Secondary | ICD-10-CM | POA: Diagnosis not present

## 2023-06-26 DIAGNOSIS — Z9181 History of falling: Secondary | ICD-10-CM | POA: Diagnosis not present

## 2023-08-30 ENCOUNTER — Other Ambulatory Visit: Payer: Self-pay | Admitting: Cardiovascular Disease

## 2023-10-18 DIAGNOSIS — Z79899 Other long term (current) drug therapy: Secondary | ICD-10-CM | POA: Diagnosis not present

## 2023-10-18 DIAGNOSIS — E7800 Pure hypercholesterolemia, unspecified: Secondary | ICD-10-CM | POA: Diagnosis not present

## 2023-10-18 DIAGNOSIS — R8290 Unspecified abnormal findings in urine: Secondary | ICD-10-CM | POA: Diagnosis not present

## 2023-10-18 DIAGNOSIS — I5020 Unspecified systolic (congestive) heart failure: Secondary | ICD-10-CM | POA: Diagnosis not present

## 2023-10-18 DIAGNOSIS — J449 Chronic obstructive pulmonary disease, unspecified: Secondary | ICD-10-CM | POA: Diagnosis not present

## 2023-10-19 LAB — LAB REPORT - SCANNED: EGFR: 74

## 2023-11-12 DIAGNOSIS — E876 Hypokalemia: Secondary | ICD-10-CM | POA: Diagnosis not present

## 2024-01-16 DIAGNOSIS — N390 Urinary tract infection, site not specified: Secondary | ICD-10-CM | POA: Diagnosis not present

## 2024-01-23 DIAGNOSIS — R8271 Bacteriuria: Secondary | ICD-10-CM | POA: Diagnosis not present

## 2024-02-19 ENCOUNTER — Other Ambulatory Visit: Payer: Self-pay | Admitting: Cardiovascular Disease

## 2024-03-19 DIAGNOSIS — N390 Urinary tract infection, site not specified: Secondary | ICD-10-CM | POA: Diagnosis not present

## 2024-04-21 DIAGNOSIS — J449 Chronic obstructive pulmonary disease, unspecified: Secondary | ICD-10-CM | POA: Diagnosis not present

## 2024-04-21 DIAGNOSIS — N390 Urinary tract infection, site not specified: Secondary | ICD-10-CM | POA: Diagnosis not present

## 2024-04-21 DIAGNOSIS — Z23 Encounter for immunization: Secondary | ICD-10-CM | POA: Diagnosis not present

## 2024-04-21 DIAGNOSIS — I5020 Unspecified systolic (congestive) heart failure: Secondary | ICD-10-CM | POA: Diagnosis not present

## 2024-04-21 DIAGNOSIS — Z79899 Other long term (current) drug therapy: Secondary | ICD-10-CM | POA: Diagnosis not present

## 2024-04-21 DIAGNOSIS — E7800 Pure hypercholesterolemia, unspecified: Secondary | ICD-10-CM | POA: Diagnosis not present

## 2024-04-22 LAB — LAB REPORT - SCANNED: EGFR: 73

## 2024-04-28 ENCOUNTER — Telehealth: Payer: Self-pay | Admitting: Cardiovascular Disease

## 2024-04-28 NOTE — Telephone Encounter (Signed)
 Pt c/o medication issue:  1. Name of Medication:   Cipro (ciproflaxin)  2. How are you currently taking this medication (dosage and times per day)?  Not started yet  3. Are you having a reaction (difficulty breathing--STAT)?   4. What is your medication issue?   Husband stated patient was put on a low dose antibiotic (Cephalexin) for a urinary infection but will been to be on Cipro.  Husband wants to know if patient can take this medication.

## 2024-04-28 NOTE — Telephone Encounter (Signed)
 While there is no absolute cardiac contraindication to this medication, it should be used cautiously in elderly patients due to his potential neurological and tendon related side effects. The risk of tendon rupture is significantly higher for older adults.   Ciprofloxacin can cross the blood-brain barrier, potentially causing confusion, anxiety, depression, and memory loss in older adults.  Considering Cleona's pre-existing cognitive issues, it might be better to find an alternative. Having said that, the prescribing physician may have taken these issues into consideration and felt that this remains the best choice. If this is to treat an active urinary tract infection in the short-term it might be acceptable.,  If this is intended to be a long-term treatment to prevent recurrent urinary tract infections, I think is best to find an alternative.

## 2024-04-28 NOTE — Telephone Encounter (Signed)
 Husband would like to know if OK for pt to take Cipro.  Will forward to Dr Francyne and the pharmacy team to review for any contraindications.

## 2024-04-28 NOTE — Telephone Encounter (Signed)
 Reviewed information provided by Dr Francyne with pt's husband who states understanding.  He reports he will call the prescribing physician in the morning to review and see if there is an alternative or if he feels cipro is necessary.  He was appreciative of the information and assistance.

## 2024-05-13 ENCOUNTER — Other Ambulatory Visit: Payer: Self-pay | Admitting: Cardiovascular Disease

## 2024-05-14 ENCOUNTER — Ambulatory Visit: Attending: Cardiovascular Disease | Admitting: Cardiovascular Disease

## 2024-05-14 ENCOUNTER — Encounter: Payer: Self-pay | Admitting: Cardiovascular Disease

## 2024-05-14 VITALS — BP 147/85 | HR 61 | Ht 60.0 in | Wt 107.2 lb

## 2024-05-14 DIAGNOSIS — E7800 Pure hypercholesterolemia, unspecified: Secondary | ICD-10-CM

## 2024-05-14 DIAGNOSIS — I5042 Chronic combined systolic (congestive) and diastolic (congestive) heart failure: Secondary | ICD-10-CM

## 2024-05-14 DIAGNOSIS — I447 Left bundle-branch block, unspecified: Secondary | ICD-10-CM | POA: Diagnosis not present

## 2024-05-14 DIAGNOSIS — I10 Essential (primary) hypertension: Secondary | ICD-10-CM | POA: Diagnosis not present

## 2024-05-14 NOTE — Patient Instructions (Signed)
 Medication Instructions:  Continue same medications *If you need a refill on your cardiac medications before your next appointment, please call your pharmacy*  Lab Work: None ordered  Testing/Procedures: None ordered  Follow-Up: At [facility], you and your health needs are our priority.  As part of our continuing mission to provide you with exceptional heart care, our providers are all part of one team.  This team includes your primary Cardiologist (physician) and Advanced Practice Providers or APPs (Physician Assistants and Nurse Practitioners) who all work together to provide you with the care you need, when you need it.  Your next appointment:  1 year   Call in May to schedule Oct appointment     Provider:  Dr.Croitoru   We recommend signing up for the patient portal called MyChart.  Sign up information is provided on this After Visit Summary.  MyChart is used to connect with patients for Virtual Visits (Telemedicine).  Patients are able to view lab/test results, encounter notes, upcoming appointments, etc.  Non-urgent messages can be sent to your provider as well.   To learn more about what you can do with MyChart, go to [email or web address].

## 2024-05-14 NOTE — Progress Notes (Unsigned)
 Cardiology Office Note    Date:  05/15/2024   ID:  Dominique Hernandez, DOB Sep 20, 1940, MRN [id number]  PCP:  Lizette Macario BRAVO, NP (Inactive)  Cardiologist:  Jerel Balding, MD   Chief Complaint  Patient presents with   Congestive Heart Failure    History of Present Illness:  Dominique Hernandez is a 83 y.o. female with moderate nonischemic cardiomyopathy but very well compensated congestive heart failure, returning for follow-up. When initially diagnosed in early 2014 LVEF was 15%, no significant CAD. After institution of medical therapy, LVEF increased to 35-40%, stable on echos in 2014 and 2018, improved to 45-50% on most recent echo echo performed 11/26/2020.  She has long-standing left bundle branch block.  She has slowly progressive dementia.  For a while now she has not been able to use any intelligible words, but seemed to understand some of our conversations.  Does not seem to understand most of what we are talking about today.  Difficulty following instructions, but cooperative with exam.  The history is obtained entirely from the family.  She developed a generalized erythematous rash that eventually proved to be an aspirin  allergy so she is no longer taking this medication.  She has not had problems with shortness of breath but is very sedentary.  She is able to sleep fully flat in bed.  She has not had any leg edema, orthopnea, PND and has been no complaints of chest pain that we can identify.  She was very sick with a urinary tract infection in June but has recovered from that.  She has been on maintenance UTI prevention with a low-dose of cephalexin.  She has lost weight and her BMI is now just under 21.  She has lost 20 pounds in the last year.   Past Medical History:  Diagnosis Date   LBBB (left bundle branch block)    Lung nodule    RLL   Nonischemic cardiomyopathy (HCC)     Past Surgical History:  Procedure Laterality Date   ABDOMINAL HYSTERECTOMY     BLADDER SURGERY      BREAST SURGERY     breast implants   LEFT AND RIGHT HEART CATHETERIZATION WITH CORONARY ANGIOGRAM N/A 10/08/2012   Procedure: LEFT AND RIGHT HEART CATHETERIZATION WITH CORONARY ANGIOGRAM;  Surgeon: Debby DELENA Sor, MD;  Location: [facility] CATH LAB;  Service: Cardiovascular;  Laterality: N/A;   RIGHT & LEFT HEART CATH  10/08/2012   20% LAD,catheter induced spasm RCA,EF 15-20%    Current Medications: Outpatient Medications Prior to Visit  Medication Sig Dispense Refill   betamethasone dipropionate 0.05 % cream Apply 1 drop topically 2 (two) times daily.     carvedilol  (COREG ) 12.5 MG tablet TAKE 1 TABLET BY MOUTH TWICE A DAY 180 tablet 3   cephALEXin (KEFLEX) 250 MG/5ML suspension Take 250 mg by mouth daily.     donepezil (ARICEPT) 23 MG TABS tablet Take 1 tablet by mouth daily.  0   memantine (NAMENDA) 10 MG tablet Take 10 mg by mouth 2 (two) times daily.  1   Omega-3 Fatty Acids (FISH OIL PO) Take 1 capsule by mouth daily.     pravastatin  (PRAVACHOL ) 80 MG tablet Take 80 mg by mouth daily.     QUEtiapine (SEROQUEL) 50 MG tablet Take 50 mg by mouth at bedtime.     enalapril  (VASOTEC ) 20 MG tablet TAKE 1 TABLET (20 MG TOTAL) BY MOUTH 2 (TWO) TIMES DAILY. KEEP OV. 180 tablet 0   aspirin  EC  81 MG EC tablet Take 1 tablet (81 mg total) by mouth daily.     QUEtiapine (SEROQUEL) 25 MG tablet Take 25 mg by mouth in the morning.     No facility-administered medications prior to visit.     Allergies:   Aspirin    Social History   Socioeconomic History   Marital status: Married    Spouse name: Not on file   Number of children: Not on file   Years of education: Not on file   Highest education level: Not on file  Occupational History   Not on file  Tobacco Use   Smoking status: Former    Current packs/day: 0.00    Types: Cigarettes    Quit date: 10/07/2003    Years since quitting: 20.6   Smokeless tobacco: Never  Substance and Sexual Activity   Alcohol use: No   Drug use: No   Sexual activity:  Yes  Other Topics Concern   Not on file  Social History Narrative   Not on file   Social Drivers of Health   Financial Resource Strain: Not on file  Food Insecurity: Not on file  Transportation Needs: Not on file  Physical Activity: Not on file  Stress: Not on file  Social Connections: Not on file     Family History:  The patient's family history includes CAD in her brother, mother, sister, sister, sister, and sister; Dementia in her mother; Other (age of onset: 61) in her father.   ROS:   Please see the history of present illness.    ROS all other systems are reviewed and are negative   PHYSICAL EXAM:   VS:  BP 132/67   Pulse 61   Ht 5' (1.524 m)   Wt 107 lb 3.2 oz (48.6 kg)   SpO2 95%   BMI 20.94 kg/m      General: Alert, oriented x3, no distress, thin. Head: no evidence of trauma, PERRL, EOMI, no exophtalmos or lid lag, no myxedema, no xanthelasma; normal ears, nose and oropharynx Neck: normal jugular venous pulsations and no hepatojugular reflux; brisk carotid pulses without delay and no carotid bruits Chest: clear to auscultation, no signs of consolidation by percussion or palpation, normal fremitus, symmetrical and full respiratory excursions Cardiovascular: normal position and quality of the apical impulse, regular rhythm, normal first and paradoxically split second heart sounds, no murmurs, rubs or gallops Abdomen: no tenderness or distention, no masses by palpation, no abnormal pulsatility or arterial bruits, normal bowel sounds, no hepatosplenomegaly Extremities: no clubbing, cyanosis or edema; 2+ radial, ulnar and brachial pulses bilaterally; 2+ right femoral, posterior tibial and dorsalis pedis pulses; 2+ left femoral, posterior tibial and dorsalis pedis pulses; no subclavian or femoral bruits Neurological: grossly nonfocal Psych: Normal mood and affect      Wt Readings from Last 3 Encounters:  05/14/24 107 lb 3.2 oz (48.6 kg)  02/21/23 127 lb (57.6 kg)   11/04/21 129 lb 9.6 oz (58.8 kg)      Studies/Labs Reviewed:   ECHO 11/26/2020:   1. Left ventricular ejection fraction, by estimation, is 45 to 50%. Left  ventricular ejection fraction by 3D volume is 49 %. The left ventricle has  mildly decreased function. The left ventricle has no regional wall motion  abnormalities. Left ventricular   diastolic parameters are consistent with Grade I diastolic dysfunction  (impaired relaxation).   2. Right ventricular systolic function is normal. The right ventricular  size is normal. There is normal pulmonary artery systolic pressure.  The  estimated right ventricular systolic pressure is 21.8 mmHg.   3. The mitral valve is normal in structure. Trivial mitral valve  regurgitation. No evidence of mitral stenosis.   4. The aortic valve is normal in structure. Aortic valve regurgitation is  not visualized. No aortic stenosis is present.   5. The inferior vena cava is normal in size with greater than 50%  respiratory variability, suggesting right atrial pressure of 3 mmHg.   Comparison(s): Prior images unable to be directly viewed, comparison made  by report only. The left ventricular function has improved.   EKG:    EKG Interpretation Date/Time:  Wednesday May 14 2024 15:54:25 EDT Ventricular Rate:  61 PR Interval:  118 QRS Duration:  124 QT Interval:  488 QTC Calculation: 491 R Axis:   -52  Text Interpretation: Normal sinus rhythm Left axis deviation Left bundle branch block When compared with ECG of 21-Feb-2023 14:36, No significant change since last tracing Confirmed by Legend Tumminello [phone]) on 05/14/2024 4:11:50 PM         Recent Labs: 06/11/2020 Cholesterol 167, HDL 57, LDL 90, TG 112 Creat 0.89, K 4.0  07/05/2021 Cholesterol 156, HDL 52, LDL 82, triglycerides 124 Creatinine 0.91, potassium 3.9, ALT 12.0  ASSESSMENT:    1. Chronic combined systolic and diastolic congestive heart failure (HCC)   2. LBBB (left bundle  branch block)   3. Essential hypertension   4. Hypercholesterolemia      PLAN:  In order of problems listed above:  CHF: NYHA functional class I (as much as we can assess) and euvolemic without the need for loop diuretics.  Remains euvolemic without diuretics.  On ACE inhibitor and beta-blocker therapy.  With problems with urinary tract infections, avoiding SGLT2 inhibitor.  We attempted transition to Entresto  but this appeared to cause agitation and restlessness and she is now back on enalapril .  No reason to start spironolactone with what appears to be mildly depressed LV function and good symptom relief. HTN: Initially high, better when rechecked. LBBB: No evidence of more severe conduction abnormalities or bradycardia.  Unlikely to benefit from CRT since the QRS duration is barely above 120 ms. HLP: Has stopped taking statins.  She has not had problems with coronary or vascular disease.  With her comorbid conditions, keeping her medicine list to a minimum appears appropriate. Dementia: Steady deterioration, now with weight loss.   Medication Adjustments/Labs and Tests Ordered: Current medicines are reviewed at length with the patient today.  Concerns regarding medicines are outlined above.  Medication changes, Labs and Tests ordered today are listed in the Patient Instructions below. Patient Instructions  Medication Instructions:  Continue same medications *If you need a refill on your cardiac medications before your next appointment, please call your pharmacy*  Lab Work: None ordered  Testing/Procedures: None ordered  Follow-Up: At [facility], you and your health needs are our priority.  As part of our continuing mission to provide you with exceptional heart care, our providers are all part of one team.  This team includes your primary Cardiologist (physician) and Advanced Practice Providers or APPs (Physician Assistants and Nurse Practitioners) who all work together to  provide you with the care you need, when you need it.  Your next appointment:  1 year   Call in May to schedule Oct appointment     Provider:  Dr.Loxley Cibrian   We recommend signing up for the patient portal called MyChart.  Sign up information is provided on this After Visit  Summary.  MyChart is used to connect with patients for Virtual Visits (Telemedicine).  Patients are able to view lab/test results, encounter notes, upcoming appointments, etc.  Non-urgent messages can be sent to your provider as well.   To learn more about what you can do with MyChart, go to [email or web address].

## 2024-05-15 ENCOUNTER — Encounter: Payer: Self-pay | Admitting: Cardiovascular Disease

## 2024-05-17 ENCOUNTER — Other Ambulatory Visit: Payer: Self-pay | Admitting: Cardiovascular Disease

## 2024-05-21 ENCOUNTER — Other Ambulatory Visit: Payer: Self-pay | Admitting: Cardiovascular Disease

## 2024-05-22 ENCOUNTER — Other Ambulatory Visit: Payer: Self-pay

## 2024-05-22 ENCOUNTER — Emergency Department (HOSPITAL_COMMUNITY)
Admission: EM | Admit: 2024-05-22 | Discharge: 2024-05-22 | Disposition: A | Discharge status: Home / Self Care | Source: Ambulatory Visit | Attending: Student in an Organized Health Care Education/Training Program | Admitting: Student in an Organized Health Care Education/Training Program

## 2024-05-22 ENCOUNTER — Telehealth: Payer: Self-pay | Admitting: Cardiovascular Disease

## 2024-05-22 ENCOUNTER — Emergency Department (HOSPITAL_COMMUNITY)

## 2024-05-22 DIAGNOSIS — R4182 Altered mental status, unspecified: Secondary | ICD-10-CM | POA: Diagnosis present

## 2024-05-22 DIAGNOSIS — F0390 Unspecified dementia without behavioral disturbance: Secondary | ICD-10-CM | POA: Diagnosis not present

## 2024-05-22 DIAGNOSIS — I509 Heart failure, unspecified: Secondary | ICD-10-CM | POA: Insufficient documentation

## 2024-05-22 DIAGNOSIS — I10 Essential (primary) hypertension: Secondary | ICD-10-CM | POA: Diagnosis not present

## 2024-05-22 LAB — URINALYSIS, ROUTINE W REFLEX MICROSCOPIC
Bilirubin Urine: NEGATIVE
Glucose, UA: NEGATIVE mg/dL
Hgb urine dipstick: NEGATIVE
Ketones, ur: NEGATIVE mg/dL
Leukocytes,Ua: NEGATIVE
Nitrite: NEGATIVE
Protein, ur: NEGATIVE mg/dL
Specific Gravity, Urine: 1.023 (ref 1.005–1.030)
pH: 5 (ref 5.0–8.0)

## 2024-05-22 LAB — CBC
HCT: 43.4 % (ref 36.0–46.0)
Hemoglobin: 13.9 g/dL (ref 12.0–15.0)
MCH: 29.3 pg (ref 26.0–34.0)
MCHC: 32 g/dL (ref 30.0–36.0)
MCV: 91.4 fL (ref 80.0–100.0)
Platelets: 247 K/uL (ref 150–400)
RBC: 4.75 MIL/uL (ref 3.87–5.11)
RDW: 12.8 % (ref 11.5–15.5)
WBC: 8 K/uL (ref 4.0–10.5)
nRBC: 0 % (ref 0.0–0.2)

## 2024-05-22 LAB — I-STAT CHEM 8, ED
BUN: 17 mg/dL (ref 8–23)
Calcium, Ion: 1.11 mmol/L — ABNORMAL LOW (ref 1.15–1.40)
Chloride: 104 mmol/L (ref 98–111)
Creatinine, Ser: 0.8 mg/dL (ref 0.44–1.00)
Glucose, Bld: 95 mg/dL (ref 70–99)
HCT: 41 % (ref 36.0–46.0)
Hemoglobin: 13.9 g/dL (ref 12.0–15.0)
Potassium: 3.6 mmol/L (ref 3.5–5.1)
Sodium: 143 mmol/L (ref 135–145)
TCO2: 27 mmol/L (ref 22–32)

## 2024-05-22 LAB — CBG MONITORING, ED: Glucose-Capillary: 101 mg/dL — ABNORMAL HIGH (ref 70–99)

## 2024-05-22 NOTE — ED Notes (Signed)
 Extra DG, Blue, Red, LG & SST tubes drawn

## 2024-05-22 NOTE — Telephone Encounter (Signed)
 Left message for husband that we can see the ED workup and looks like ev erything is okay. He can call back with concerns.

## 2024-05-22 NOTE — ED Provider Notes (Signed)
 [place] EMERGENCY DEPARTMENT AT [facility] Provider Note   CSN: [id number] Arrival date & time: 05/22/24  1035     Patient presents with: Altered Mental Status and stro0ke symptoms   Dominique Hernandez is a 83 y.o. female.   83 year old female with past medical history of recurrent UTIs and CHF who presents emergency department with her family due to altered mental status.  Family reports that she was at her normal neurologic baseline last night around 6 PM.  She woke up this morning appearing more confused and fatigued.  They deny any recent falls or recent illnesses.  She does have a noted history of dementia as well.  Husband reports that she takes Keflex daily to prevent UTIs.  They deny any recent fevers.   Altered Mental Status      Prior to Admission medications   Medication Sig Start Date End Date Taking? Authorizing Provider  carvedilol  (COREG ) 12.5 MG tablet TAKE 1 TABLET BY MOUTH TWICE A DAY 05/20/24   Croitoru, Mihai, MD  cephALEXin (KEFLEX) 250 MG/5ML suspension Take 250 mg by mouth daily. 05/07/24   [provider]  donepezil (ARICEPT) 23 MG TABS tablet Take 1 tablet by mouth daily. 04/11/16   [provider]  enalapril  (VASOTEC ) 20 MG tablet Take 1 tablet (20 mg total) by mouth 2 (two) times daily. 05/15/24   Croitoru, Mihai, MD  memantine (NAMENDA) 10 MG tablet Take 10 mg by mouth 2 (two) times daily. 04/21/16   [provider]  Omega-3 Fatty Acids (FISH OIL PO) Take 1 capsule by mouth daily.    [provider]  QUEtiapine (SEROQUEL) 50 MG tablet Take 50 mg by mouth at bedtime. 01/26/23   [provider]    Allergies: Aspirin     Review of Systems  All other systems reviewed and are negative.   Updated Vital Signs BP (!) 147/82   Pulse 63   Temp 97.7 F (36.5 C) (Oral)   Resp 18   SpO2 95%   Physical Exam Vitals and nursing note reviewed.  Constitutional:      Comments: elderly  HENT:     Head:  Atraumatic.     Nose: Nose normal.     Mouth/Throat:     Mouth: Mucous membranes are dry.  Eyes:     Extraocular Movements: Extraocular movements intact.     Conjunctiva/sclera: Conjunctivae normal.     Pupils: Pupils are equal, round, and reactive to light.  Cardiovascular:     Rate and Rhythm: Normal rate.  Pulmonary:     Effort: Pulmonary effort is normal.  Abdominal:     Tenderness: There is no abdominal tenderness.  Skin:    General: Skin is warm.  Neurological:     Comments: No focal deficits No facial asymmetry Moving all extremities equally     (all labs ordered are listed, but only abnormal results are displayed) Labs Reviewed  URINALYSIS, ROUTINE W REFLEX MICROSCOPIC - Abnormal; Notable for the following components:      Result Value   Color, Urine AMBER (*)    All other components within normal limits  CBG MONITORING, ED - Abnormal; Notable for the following components:   Glucose-Capillary 101 (*)    All other components within normal limits  I-STAT CHEM 8, ED - Abnormal; Notable for the following components:   Calcium, Ion 1.11 (*)    All other components within normal limits  CBC    EKG: None  Radiology: CT Head Wo  Contrast Result Date: 05/22/2024 CLINICAL DATA:  Delirium, altered mental status, weakness EXAM: CT HEAD WITHOUT CONTRAST TECHNIQUE: Contiguous axial images were obtained from the base of the skull through the vertex without intravenous contrast. RADIATION DOSE REDUCTION: This exam was performed according to the departmental dose-optimization program which includes automated exposure control, adjustment of the mA and/or kV according to patient size and/or use of iterative reconstruction technique. COMPARISON:  10/18/2018 FINDINGS: Brain: There is severe diffuse atrophy and chronic small vessel disease changes. No acute intracranial abnormality. Specifically, no hemorrhage, hydrocephalus, mass lesion, acute infarction, or significant intracranial  injury. Vascular: No hyperdense vessel or unexpected calcification. Skull: No acute calvarial abnormality. Sinuses/Orbits: Air-fluid level in the right sphenoid sinus, new since prior study. Other: None IMPRESSION: Severe diffuse atrophy and chronic small vessel disease. No acute intracranial abnormality. Air-fluid level in the right sphenoid sinus may reflect acute sinusitis. Electronically Signed   By: Franky Crease M.D.   On: 05/22/2024 11:13     Procedures   Medications Ordered in the ED - No data to display                                  Medical Decision Making 83 year old female presenting to the emergency department with her family due to altered mental status.  Differential includes but not limited to stroke, UTI, electrolyte abnormality, dementia, and others.  She does not have any focal deficits that would indicate a stroke, however a head CT was performed and did not show any acute abnormalities.  She continues to appear more fatigued than her baseline.  Lab work and urine ordered to rule out any evidence of infection or other abnormalities. Patient's workup has not revealed any cause of her symptoms.  Her urinalysis is clear for evidence of infection.  Family believes she is returning back to her normal baseline.  They states she is acting more like herself and asking to go home.  Neuroexam continues to be difficult secondary to her severe baseline dementia.  I offered admission versus discharge to the family.  Family has decided to take her home at this time.  They will return if she has any worsening symptoms.  They feel comfortable managing her at home since she is now returning back to her normal baseline.    Final diagnoses:  Altered mental status, unspecified altered mental status type  Dementia, unspecified dementia severity, unspecified dementia type, unspecified whether behavioral, psychotic, or mood disturbance or anxiety [facility])    ED Discharge Orders     None           Baldo Hufnagle, DO 05/22/24 1224

## 2024-05-22 NOTE — Discharge Instructions (Addendum)
 Dominique Hernandez was evaluated in the emergency department today and her workup did not show any significant changes that would explain her behavior this morning.  If her symptoms return, you can bring her back to the emergency department and we can proceed with admission.  If you have any further concerns or questions please return to the ED.

## 2024-05-22 NOTE — ED Provider Triage Note (Signed)
 Emergency Medicine Provider Triage Evaluation Note  Dominique Hernandez , a 83 y.o. female  was evaluated in triage.  Pt complains of generalized weakness and altered mental status.  History is given by husband who is at bedside.  He states patient has dementia.  She was last known normal at 6 to 7 PM last night.  He states that he got her out of bed around 820 this morning.  He states that she was not her normal self then.  She was not opening her eyes.  He did assist her to the bathroom which is her ambulating at baseline.  He states she is did seem to be limping somewhat.  However, he sat her down at the table and she seemed less responsive than usual.  He called [facility].  He reports that her blood sugar was normal on their arrival.  He wanted to bring her to [facility] and signed her out AMA..  Review of Systems  Positive: Patient with history of dementia, weaker than usual Negative: No history of trauma  Physical Exam  BP (!) 139/90 (BP Location: Left Arm)   Pulse 68   Resp 18   SpO2 100%  Gen:   Awake, no distress   Resp:  Normal effort  MSK:   Moves extremities without difficulty  Other:  Patient with pinpoint pupils.  No obvious asymmetry to face.  She is moving both arms.  She is difficult to assess due to her dementia.  Medical Decision Making  Medically screening exam initiated at 10:51 AM.  Appropriate orders placed.  Dominique Hernandez was informed that the remainder of the evaluation will be completed by another provider, this initial triage assessment does not replace that evaluation, and the importance of remaining in the ED until their evaluation is complete.  Patient is to be directly bedded.   Levander Houston, MD 05/22/24 [phone]

## 2024-05-22 NOTE — Telephone Encounter (Signed)
 Husband states that they are on the way to the hosptial, he thinks the patient had a stroke. Please advise

## 2024-05-23 ENCOUNTER — Telehealth: Payer: Self-pay | Admitting: Cardiovascular Disease

## 2024-05-23 MED ORDER — CARVEDILOL 12.5 MG PO TABS: 12.5000 mg | ORAL_TABLET | Freq: Two times a day (BID) | ORAL | 3 refills | Status: DC

## 2024-05-23 NOTE — Telephone Encounter (Signed)
*  STAT* If patient is at the pharmacy, call can be transferred to refill team.   1. Which medications need to be refilled? (please list name of each medication and dose if known)   carvedilol  (COREG ) 12.5 MG tablet     2. Would you like to learn more about the convenience, safety, & potential cost savings by using the [facility]? no   3. Are you open to using the [facility] (Type [facility].  ).no    4. Which pharmacy/location (including street and city if local pharmacy) is medication to be sent to? [id number] - [place],  - [address] [place] [facility]     5. Do they need a 30 day or 90 day supply? 90 day    Pt is out of medication

## 2024-05-23 NOTE — Telephone Encounter (Signed)
 Rx sent in. Pt's husband advised. Verbalized understanding.

## 2024-06-24 ENCOUNTER — Other Ambulatory Visit: Payer: Self-pay

## 2024-06-24 ENCOUNTER — Inpatient Hospital Stay (HOSPITAL_COMMUNITY)

## 2024-06-24 ENCOUNTER — Emergency Department (HOSPITAL_COMMUNITY)

## 2024-06-24 ENCOUNTER — Inpatient Hospital Stay (HOSPITAL_COMMUNITY)
Admission: EM | Admit: 2024-06-24 | Discharge: 2024-06-28 | DRG: 682 | Disposition: A | Discharge status: Hospice | Attending: Family Medicine | Admitting: Family Medicine

## 2024-06-24 DIAGNOSIS — A419 Sepsis, unspecified organism: Secondary | ICD-10-CM

## 2024-06-24 DIAGNOSIS — Z7401 Bed confinement status: Secondary | ICD-10-CM

## 2024-06-24 DIAGNOSIS — Z9882 Breast implant status: Secondary | ICD-10-CM

## 2024-06-24 DIAGNOSIS — J189 Pneumonia, unspecified organism: Secondary | ICD-10-CM | POA: Diagnosis not present

## 2024-06-24 DIAGNOSIS — F0280 Dementia in other diseases classified elsewhere without behavioral disturbance: Secondary | ICD-10-CM | POA: Diagnosis present

## 2024-06-24 DIAGNOSIS — Z8249 Family history of ischemic heart disease and other diseases of the circulatory system: Secondary | ICD-10-CM

## 2024-06-24 DIAGNOSIS — E870 Hyperosmolality and hypernatremia: Secondary | ICD-10-CM | POA: Diagnosis present

## 2024-06-24 DIAGNOSIS — Z1152 Encounter for screening for COVID-19: Secondary | ICD-10-CM

## 2024-06-24 DIAGNOSIS — G9341 Metabolic encephalopathy: Secondary | ICD-10-CM | POA: Diagnosis present

## 2024-06-24 DIAGNOSIS — I447 Left bundle-branch block, unspecified: Secondary | ICD-10-CM | POA: Diagnosis present

## 2024-06-24 DIAGNOSIS — E876 Hypokalemia: Secondary | ICD-10-CM | POA: Diagnosis not present

## 2024-06-24 DIAGNOSIS — I5022 Chronic systolic (congestive) heart failure: Secondary | ICD-10-CM | POA: Diagnosis present

## 2024-06-24 DIAGNOSIS — Z87440 Personal history of urinary (tract) infections: Secondary | ICD-10-CM

## 2024-06-24 DIAGNOSIS — N179 Acute kidney failure, unspecified: Principal | ICD-10-CM | POA: Diagnosis present

## 2024-06-24 DIAGNOSIS — Z90710 Acquired absence of both cervix and uterus: Secondary | ICD-10-CM

## 2024-06-24 DIAGNOSIS — G309 Alzheimer's disease, unspecified: Secondary | ICD-10-CM | POA: Diagnosis present

## 2024-06-24 DIAGNOSIS — I428 Other cardiomyopathies: Secondary | ICD-10-CM | POA: Diagnosis present

## 2024-06-24 DIAGNOSIS — E785 Hyperlipidemia, unspecified: Secondary | ICD-10-CM | POA: Diagnosis present

## 2024-06-24 DIAGNOSIS — G9340 Encephalopathy, unspecified: Secondary | ICD-10-CM | POA: Diagnosis present

## 2024-06-24 DIAGNOSIS — R1310 Dysphagia, unspecified: Secondary | ICD-10-CM | POA: Diagnosis present

## 2024-06-24 DIAGNOSIS — Z515 Encounter for palliative care: Secondary | ICD-10-CM

## 2024-06-24 DIAGNOSIS — Z66 Do not resuscitate: Secondary | ICD-10-CM | POA: Diagnosis present

## 2024-06-24 DIAGNOSIS — Z87891 Personal history of nicotine dependence: Secondary | ICD-10-CM

## 2024-06-24 DIAGNOSIS — R6520 Severe sepsis without septic shock: Secondary | ICD-10-CM

## 2024-06-24 DIAGNOSIS — R0902 Hypoxemia: Secondary | ICD-10-CM | POA: Diagnosis present

## 2024-06-24 DIAGNOSIS — J690 Pneumonitis due to inhalation of food and vomit: Secondary | ICD-10-CM | POA: Diagnosis present

## 2024-06-24 DIAGNOSIS — Z886 Allergy status to analgesic agent status: Secondary | ICD-10-CM

## 2024-06-24 DIAGNOSIS — E860 Dehydration: Principal | ICD-10-CM | POA: Diagnosis present

## 2024-06-24 DIAGNOSIS — Z79899 Other long term (current) drug therapy: Secondary | ICD-10-CM

## 2024-06-24 LAB — URINALYSIS, W/ REFLEX TO CULTURE (INFECTION SUSPECTED)
Bacteria, UA: NONE SEEN
Bilirubin Urine: NEGATIVE
Glucose, UA: NEGATIVE mg/dL
Hgb urine dipstick: NEGATIVE
Ketones, ur: NEGATIVE mg/dL
Leukocytes,Ua: NEGATIVE
Nitrite: NEGATIVE
Protein, ur: NEGATIVE mg/dL
Specific Gravity, Urine: 1.009 (ref 1.005–1.030)
pH: 7 (ref 5.0–8.0)

## 2024-06-24 LAB — CBC WITH DIFFERENTIAL/PLATELET
Abs Immature Granulocytes: 0.05 K/uL (ref 0.00–0.07)
Basophils Absolute: 0.1 K/uL (ref 0.0–0.1)
Basophils Relative: 0 %
Eosinophils Absolute: 0.2 K/uL (ref 0.0–0.5)
Eosinophils Relative: 2 %
HCT: 49.6 % — ABNORMAL HIGH (ref 36.0–46.0)
Hemoglobin: 14.6 g/dL (ref 12.0–15.0)
Immature Granulocytes: 0 %
Lymphocytes Relative: 32 %
Lymphs Abs: 4.1 K/uL — ABNORMAL HIGH (ref 0.7–4.0)
MCH: 28.5 pg (ref 26.0–34.0)
MCHC: 29.4 g/dL — ABNORMAL LOW (ref 30.0–36.0)
MCV: 96.7 fL (ref 80.0–100.0)
Monocytes Absolute: 1.2 K/uL — ABNORMAL HIGH (ref 0.1–1.0)
Monocytes Relative: 10 %
Neutro Abs: 7 K/uL (ref 1.7–7.7)
Neutrophils Relative %: 56 %
Platelets: 294 K/uL (ref 150–400)
RBC: 5.13 MIL/uL — ABNORMAL HIGH (ref 3.87–5.11)
RDW: 13.9 % (ref 11.5–15.5)
WBC: 12.6 K/uL — ABNORMAL HIGH (ref 4.0–10.5)
nRBC: 0 % (ref 0.0–0.2)

## 2024-06-24 LAB — BASIC METABOLIC PANEL WITH GFR
Anion gap: 9 (ref 5–15)
BUN: 67 mg/dL — ABNORMAL HIGH (ref 8–23)
CO2: 24 mmol/L (ref 22–32)
Calcium: 7.9 mg/dL — ABNORMAL LOW (ref 8.9–10.3)
Chloride: 126 mmol/L — ABNORMAL HIGH (ref 98–111)
Creatinine, Ser: 1.24 mg/dL — ABNORMAL HIGH (ref 0.44–1.00)
GFR, Estimated: 43 mL/min — ABNORMAL LOW (ref >60)
Glucose, Bld: 93 mg/dL (ref 70–99)
Potassium: 4 mmol/L (ref 3.5–5.1)
Sodium: 159 mmol/L — ABNORMAL HIGH (ref 135–145)

## 2024-06-24 LAB — I-STAT CG4 LACTIC ACID, ED: Lactic Acid, Venous: 1.9 mmol/L (ref 0.5–1.9)

## 2024-06-24 LAB — COMPREHENSIVE METABOLIC PANEL WITH GFR
ALT: 7 U/L (ref 0–44)
AST: 15 U/L (ref 15–41)
Albumin: 2.9 g/dL — ABNORMAL LOW (ref 3.5–5.0)
Alkaline Phosphatase: 61 U/L (ref 38–126)
Anion gap: 17 — ABNORMAL HIGH (ref 5–15)
BUN: 76 mg/dL — ABNORMAL HIGH (ref 8–23)
CO2: 20 mmol/L — ABNORMAL LOW (ref 22–32)
Calcium: 9.1 mg/dL (ref 8.9–10.3)
Chloride: 123 mmol/L — ABNORMAL HIGH (ref 98–111)
Creatinine, Ser: 1.95 mg/dL — ABNORMAL HIGH (ref 0.44–1.00)
GFR, Estimated: 25 mL/min — ABNORMAL LOW (ref >60)
Glucose, Bld: 108 mg/dL — ABNORMAL HIGH (ref 70–99)
Potassium: 3.9 mmol/L (ref 3.5–5.1)
Sodium: 160 mmol/L — ABNORMAL HIGH (ref 135–145)
Total Bilirubin: 1 mg/dL (ref 0.0–1.2)
Total Protein: 6.6 g/dL (ref 6.5–8.1)

## 2024-06-24 LAB — CBG MONITORING, ED: Glucose-Capillary: 112 mg/dL — ABNORMAL HIGH (ref 70–99)

## 2024-06-24 LAB — PROTIME-INR
INR: 1.1 (ref 0.8–1.2)
Prothrombin Time: 14.7 s (ref 11.4–15.2)

## 2024-06-24 MED ORDER — ACETAMINOPHEN 325 MG PO TABS
650.0000 mg | ORAL_TABLET | Freq: Four times a day (QID) | ORAL | Status: DC | PRN
  Administered 2024-06-26: 650 mg via ORAL
  Filled 2024-06-24: qty 2

## 2024-06-24 MED ORDER — SODIUM CHLORIDE 0.9 % IV BOLUS (SEPSIS)
500.0000 mL | Freq: Once | INTRAVENOUS | Status: AC
Start: 2024-06-24 — End: 2024-06-24
  Administered 2024-06-24: 500 mL via INTRAVENOUS

## 2024-06-24 MED ORDER — LORAZEPAM 2 MG/ML IJ SOLN
1.0000 mg | Freq: Once | INTRAMUSCULAR | Status: AC
  Administered 2024-06-24: 1 mg via INTRAVENOUS
  Filled 2024-06-24: qty 1

## 2024-06-24 MED ORDER — SODIUM CHLORIDE 0.9 % IV SOLN: 100.0000 mg | Freq: Two times a day (BID) | INTRAVENOUS | Status: DC

## 2024-06-24 MED ORDER — DEXTROSE 5 % IV SOLN: INTRAVENOUS | Status: AC

## 2024-06-24 MED ORDER — ONDANSETRON HCL 4 MG/2ML IJ SOLN: 4.0000 mg | Freq: Four times a day (QID) | INTRAMUSCULAR | Status: DC | PRN

## 2024-06-24 MED ORDER — ACETAMINOPHEN 650 MG RE SUPP: 650.0000 mg | Freq: Four times a day (QID) | RECTAL | Status: DC | PRN

## 2024-06-24 MED ORDER — SODIUM CHLORIDE 0.9 % IV SOLN
2.0000 g | Freq: Once | INTRAVENOUS | Status: AC
  Administered 2024-06-24: 2 g via INTRAVENOUS
  Filled 2024-06-24: qty 20

## 2024-06-24 MED ORDER — SODIUM CHLORIDE 0.9 % IV BOLUS (SEPSIS)
1000.0000 mL | Freq: Once | INTRAVENOUS | Status: AC
Start: 2024-06-24 — End: 2024-06-24
  Administered 2024-06-24: 1000 mL via INTRAVENOUS

## 2024-06-24 MED ORDER — SODIUM CHLORIDE 0.9 % IV SOLN
100.0000 mg | Freq: Two times a day (BID) | INTRAVENOUS | Status: DC
  Administered 2024-06-24 – 2024-06-27 (×6): 100 mg via INTRAVENOUS
  Filled 2024-06-24 (×7): qty 100

## 2024-06-24 MED ORDER — ONDANSETRON HCL 4 MG PO TABS: 4.0000 mg | ORAL_TABLET | Freq: Four times a day (QID) | ORAL | Status: DC | PRN

## 2024-06-24 MED ORDER — HEPARIN SODIUM (PORCINE) 5000 UNIT/ML IJ SOLN
5000.0000 [IU] | Freq: Three times a day (TID) | INTRAMUSCULAR | Status: DC
  Administered 2024-06-24 – 2024-06-28 (×11): 5000 [IU] via SUBCUTANEOUS
  Filled 2024-06-24 (×11): qty 1

## 2024-06-24 MED ORDER — SODIUM CHLORIDE 0.9 % IV SOLN
2.0000 g | INTRAVENOUS | Status: DC
  Administered 2024-06-25 – 2024-06-26 (×2): 2 g via INTRAVENOUS
  Filled 2024-06-24 (×3): qty 20

## 2024-06-24 MED ORDER — SODIUM CHLORIDE 0.9 % IV SOLN: 100.0000 mg | Freq: Once | INTRAVENOUS | Status: DC

## 2024-06-24 NOTE — ED Triage Notes (Signed)
 Pt BIB husband, says pt has late stage Alzheimer's and patient has been less responsive for past week, past 3 days has stopped eating and being able to swallow. Pt is nonverbal and altered at baseline. Husband said 2 days ago he noticed a rapid mental decline. Pt responsive to pain at this time.

## 2024-06-24 NOTE — Sepsis Progress Note (Signed)
 eLink is following this Code Sepsis.

## 2024-06-24 NOTE — ED Provider Notes (Addendum)
 [place] EMERGENCY DEPARTMENT AT [facility] Provider Note   CSN: [id number] Arrival date & time: 06/24/24  1242     Patient presents with: Weakness   Dominique Hernandez is a 83 y.o. female.   HPI     83 year old patient comes in with chief complaint of altered mental status.  Patient accompanied by husband, who provides substantial part of the history.  Patient has history of nonischemic cardiomyopathy with a EF of 40%, advanced dementia.  According to the husband, patient was brought to the emergency room about a month ago.  She was doing fine for about 2 weeks after being discharged, but then started slowly declining.  In the last 2 weeks, patient has required him feeding her, providing her water and also crushing her pills.  On occasions, patient has had some gagging and choking.  In the last 2 days however she has stopped eating and drinking.  Patient at baseline is alert able to carry on conversation and with assistance able to get around the house.  She has steadily declined in the last 2 weeks and has been bedbound for the last 2 days.  Patient has had recurrent UTI in the past and is on prophylactic antibiotic for prevention. He denies any fevers, chills.  Patient was driven here from [facility] by the patient because he had made the plan that if she was not improving today, he was discovered bring her to the ER.  Patient arrived to the ER and was noted to be hypoxic, 89% on room air.  Prior to Admission medications   Medication Sig Start Date End Date Taking? Authorizing Provider  carvedilol  (COREG ) 12.5 MG tablet Take 1 tablet (12.5 mg total) by mouth 2 (two) times daily. 05/23/24  Yes Croitoru, Mihai, MD  cephALEXin (KEFLEX) 250 MG/5ML suspension Take 250 mg by mouth daily. 05/07/24  Yes [provider]  donepezil  (ARICEPT ) 23 MG TABS tablet Take 1 tablet by mouth daily. 04/11/16  Yes [provider]  enalapril  (VASOTEC ) 20 MG tablet  Take 1 tablet (20 mg total) by mouth 2 (two) times daily. 05/15/24  Yes Croitoru, Mihai, MD  memantine  (NAMENDA ) 10 MG tablet Take 10 mg by mouth 2 (two) times daily. 04/21/16  Yes [provider]  QUEtiapine (SEROQUEL) 50 MG tablet Take 50 mg by mouth at bedtime. 01/26/23  Yes [provider]    Allergies: Aspirin     Review of Systems  Updated Vital Signs BP 123/66   Pulse 79   Temp (!) 97.1 F (36.2 C) (Axillary)   Resp 15   SpO2 98%   Physical Exam Vitals and nursing note reviewed.  Constitutional:      Appearance: She is well-developed.  HENT:     Head: Atraumatic.     Mouth/Throat:     Mouth: Mucous membranes are dry.  Eyes:     Extraocular Movements: Extraocular movements intact.     Pupils: Pupils are equal, round, and reactive to light.     Comments: Pupils are 2 mm and equal  Cardiovascular:     Rate and Rhythm: Normal rate.  Pulmonary:     Effort: Pulmonary effort is normal.  Musculoskeletal:     Cervical back: Normal range of motion and neck supple.  Skin:    General: Skin is warm and dry.  Neurological:     Mental Status: She is alert. She is disoriented.     Comments: Patient is noted to be moving all 4 extremities  and responding to noxious stimuli.       (all labs ordered are listed, but only abnormal results are displayed) Labs Reviewed  COMPREHENSIVE METABOLIC PANEL WITH GFR - Abnormal; Notable for the following components:      Result Value   Sodium 160 (*)    Chloride 123 (*)    CO2 20 (*)    Glucose, Bld 108 (*)    BUN 76 (*)    Creatinine, Ser 1.95 (*)    Albumin 2.9 (*)    GFR, Estimated 25 (*)    Anion gap 17 (*)    All other components within normal limits  CBC WITH DIFFERENTIAL/PLATELET - Abnormal; Notable for the following components:   WBC 12.6 (*)    RBC 5.13 (*)    HCT 49.6 (*)    MCHC 29.4 (*)    Lymphs Abs 4.1 (*)    Monocytes Absolute 1.2 (*)    All other components within normal limits  CBG MONITORING, ED  - Abnormal; Notable for the following components:   Glucose-Capillary 112 (*)    All other components within normal limits  RESP PANEL BY RT-PCR (RSV, FLU A&B, COVID)  RVPGX2  CULTURE, BLOOD (ROUTINE X 2)  CULTURE, BLOOD (ROUTINE X 2)  PROTIME-INR  URINALYSIS, W/ REFLEX TO CULTURE (INFECTION SUSPECTED)  I-STAT CG4 LACTIC ACID, ED  I-STAT CG4 LACTIC ACID, ED    EKG: EKG Interpretation Date/Time:  Tuesday June 24 2024 12:51:43 EST Ventricular Rate:  96 PR Interval:  115 QRS Duration:  111 QT Interval:  394 QTC Calculation: 498 R Axis:   -68  Text Interpretation: Sinus rhythm Borderline short PR interval LVH with secondary repolarization abnormality Inferior infarct, old Anterior infarct, old No significant change since last tracing Confirmed by Charlyn Sora ([id number]) on 06/24/2024 3:14:28 PM  Radiology: [facility] Chest Port 1 View Result Date: 06/24/2024 CLINICAL DATA:  Questionable sepsis-evaluate for abnormality. Alzheimer's disease with decreased responsiveness over the past week. EXAM: PORTABLE CHEST 1 VIEW COMPARISON:  Radiographs 10/06/2012 and 07/01/2005. Chest CT 03/04/2013. FINDINGS: 1315 hours. The heart size and mediastinal contours are stable with mild aortic atherosclerosis. New mild blunting of the left costophrenic angle with minimal patchy left basilar airspace disease. The right lung is clear. No pneumothorax. The bones appear unremarkable. Peripherally calcified breast implants are noted bilaterally. IMPRESSION: New mild blunting of the left costophrenic angle with minimal patchy left basilar airspace disease, which could reflect atelectasis or early pneumonia. Electronically Signed   By: Elsie Perone M.D.   On: 06/24/2024 14:56     .Critical Care  Performed by: Charlyn Sora, MD Authorized by: Charlyn Sora, MD   Critical care provider statement:    Critical care time (minutes):  58   Critical care was necessary to treat or prevent imminent or  life-threatening deterioration of the following conditions:  Circulatory failure, renal failure and CNS failure or compromise   Critical care was time spent personally by me on the following activities:  Development of treatment plan with patient or surrogate, discussions with consultants, evaluation of patient's response to treatment, examination of patient, ordering and review of laboratory studies, ordering and review of radiographic studies, ordering and performing treatments and interventions, pulse oximetry, re-evaluation of patient's condition and review of old charts    Medications Ordered in the ED  doxycycline  (VIBRAMYCIN ) 100 mg in sodium chloride  0.9 % 250 mL IVPB (has no administration in time range)  sodium chloride  0.9 % bolus 1,000 mL (0 mLs Intravenous Stopped  06/24/24 1455)    And  sodium chloride  0.9 % bolus 500 mL (500 mLs Intravenous New Bag/Given 06/24/24 1512)  cefTRIAXone  (ROCEPHIN ) 2 g in sodium chloride  0.9 % 100 mL IVPB (0 g Intravenous Stopped 06/24/24 1416)    Clinical Course as of 06/24/24 1534  Tue Jun 24, 2024  1439 Sepsis hemodynamic reassessment completed.  BP has improved.  O2 sats on oxygen is at 98%. [AN]  1439 Patient has received 1.5 L of normal saline. [AN]  1439 Comprehensive metabolic panel(!) Labs are concerning for hypernatremia, AKI.  This is all likely prerenal.  Patient has received 30 cc/kg to make her euvolemic.  She will likely need additional fluid, but the fluid correction will need to be gradual.  Will defer additional fluid management to admitting team. [AN]  1533 Nursing staff were unsuccessful in getting In-N-Out cath.  Hospital staff has requested Foley catheter which I ordered.  Patient had some abdominal discomfort on my repeat evaluation. CMP is normal.  I will get a CT abdomen pelvis noncontrast.  Dr. Verdene will follow-up on the CT. [AN]    Clinical Course User Index [AN] Charlyn Sora, MD                                  Medical Decision Making Amount and/or Complexity of Data Reviewed Labs: ordered. Decision-making details documented in ED Course. Radiology: ordered.  Risk Decision regarding hospitalization.   83 year old patient with pertinent past medical history of CHF, advanced dementia and recurrent UTI comes in with chief complaint of acute altered mental status change.  Collateral history provided by patient's husband was at the bedside.  I have reviewed patient's previous records including the medications.  Differential diagnosis considered for this patient includes: ICH / Stroke, acute coronary syndrome, Infection - UTI/Pneumonia/soft tissue infection leading to encephalopathy, encephalopathy due to electrolyte abnormality or drug interactions or toxins or metabolic conditions like adrenal insufficiency, hyperglycemia, paraneoplastic process, hypoxia.  Based on initial assessment, higher suspicion for aspiration pneumonia as patient is O2 sats are around 88 to 90% on room air.  She does not have any respiratory distress.  Pulmonary edema and pulmonary embolism are also a possibility.  We will activate code sepsis.  Reassessment: I have independently reviewed the following labs: Patient has AKI with uremia and also hyponatremia with sodium of 160.  I have independently reviewed the following imaging: X-ray of the chest, there appears to be some left-sided opacity.  Concerns for pneumonia.  Patient was also reassessed at 3 PM.  Exam is unchanged.  Labs are back and looking overall consistent with exam finding. Based on the overall workup, suspicion is high for aspiration pneumonia, unlikely evolving sepsis.  Sepsis hemodynamic reassessment completed.  Broad-spectrum antibiotics given. Patient's hyponatremia is likely because of profound dehydration.  I have given her 30 cc/kg of normal saline here with the goal of making her euvolemic.  Will defer additional fluid management to admitting  service.  Per nursing staff, they were unable to get urine with In-N-Out cath.  Will defer Foley catheter decision to admitting team.  I have also put in a palliative medicine consult.  Patient has advanced dementia, aspiration and likely has overall poor prognosis given the progressive nature of her disease.  Disposition: Admit to stepdown ICU for hypernatremia.   Final diagnoses:  Severe dehydration  Community acquired pneumonia of left lung, unspecified part of lung  Severe  sepsis (HCC)  AKI (acute kidney injury)  Acute hypernatremia    ED Discharge Orders     None          Charlyn Sora, MD 06/24/24 1518    Charlyn Sora, MD 06/24/24 1534

## 2024-06-24 NOTE — ED Notes (Signed)
 Unsuccessful attempt at in and out catheter, MD notified

## 2024-06-24 NOTE — H&P (Addendum)
 [facility] History and Physical  Dominique Hernandez FMW:[id number] DOB: 03/15/1941 DOA: 06/24/2024   PCP: Stephanie Charlene CROME, MD  Specialists: None  Chief Complaint: Poor oral intake for past few weeks along with alteration in mental status  HPI: Dominique Hernandez is a 83 y.o. female with a past medical history of advanced Alzheimer's dementia, LBBB, nonischemic cardiomyopathy who lives with her husband.  He is the primary caregiver.  Most of the history is obtained from him.  Patient is confused and unable to provide any history at this time.  Patient's husband mentioned that about 4 weeks ago patient was noted to be more confused and was not eating and drinking, so he brought her to the hospital to rule out stroke.  No stroke was found.  Patient was sent back home.  She has had poor oral intake since then.  She has been responding less than usual since then.  Patient has lost a lot of weight in the last few months.  She has been nonambulatory for several years.  Patient's husband has been trying to feed her despite she not eating or drinking.  He also mentions that patient has had difficulty swallowing.  He has been trying to feed her despite this.  In the emergency department patient was found to be profoundly dehydrated with hypernatremia and acute kidney injury.  Patient will be hospitalized for further management.  Home Medications: Prior to Admission medications   Medication Sig Start Date End Date Taking? Authorizing Provider  carvedilol  (COREG ) 12.5 MG tablet Take 1 tablet (12.5 mg total) by mouth 2 (two) times daily. 05/23/24  Yes Croitoru, Mihai, MD  cephALEXin (KEFLEX) 250 MG/5ML suspension Take 250 mg by mouth daily. 05/07/24  Yes [provider]  donepezil  (ARICEPT ) 23 MG TABS tablet Take 1 tablet by mouth daily. 04/11/16  Yes [provider]  enalapril  (VASOTEC ) 20 MG tablet Take 1 tablet (20 mg total) by mouth 2 (two) times daily. 05/15/24  Yes Croitoru,  Mihai, MD  memantine  (NAMENDA ) 10 MG tablet Take 10 mg by mouth 2 (two) times daily. 04/21/16  Yes [provider]  QUEtiapine (SEROQUEL) 50 MG tablet Take 50 mg by mouth at bedtime. 01/26/23  Yes [provider]    Allergies:  Allergies  Allergen Reactions   Aspirin  Other (See Comments)    Causes redness on skin    Past Medical History: Past Medical History:  Diagnosis Date   LBBB (left bundle branch block)    Lung nodule    RLL   Nonischemic cardiomyopathy (HCC)     Past Surgical History:  Procedure Laterality Date   ABDOMINAL HYSTERECTOMY     BLADDER SURGERY     BREAST SURGERY     breast implants   LEFT AND RIGHT HEART CATHETERIZATION WITH CORONARY ANGIOGRAM N/A 10/08/2012   Procedure: LEFT AND RIGHT HEART CATHETERIZATION WITH CORONARY ANGIOGRAM;  Surgeon: Debby DELENA Sor, MD;  Location: [facility] CATH LAB;  Service: Cardiovascular;  Laterality: N/A;   RIGHT & LEFT HEART CATH  10/08/2012   20% LAD,catheter induced spasm RCA,EF 15-20%    Social History: Lives with her husband.  Quit smoking 20 years ago.  No alcohol  use.  Nonambulatory.   Family History:  Family History  Problem Relation Age of Onset   CAD Mother    Dementia Mother    Other Father 57       rocky mountain spotted fever   CAD Sister    CAD Brother  CAD Sister    CAD Sister    CAD Sister      Review of Systems -unable to do due to her altered mental status  Physical Examination  Vitals:   06/24/24 1301 06/24/24 1335 06/24/24 1345 06/24/24 1400  BP:    123/66  Pulse:   81 79  Resp: 14  15 15   Temp:  (!) 97.1 F (36.2 C)    TempSrc:  Axillary    SpO2:   96% 98%    BP 123/66   Pulse 79   Temp (!) 97.1 F (36.2 C) (Axillary)   Resp 15   SpO2 98%   General appearance: alert, combative, and no distress.  She appears to be cachectic. Head: Normocephalic, without obvious abnormality, atraumatic Eyes: conjunctivae/corneas clear. PERRL,  Throat: Dry mucous membranes Neck: no  adenopathy, no carotid bruit, no JVD, supple, symmetrical, trachea midline, and thyroid not enlarged, symmetric, no tenderness/mass/nodules Resp: Mildly tachypneic.  Diminished air entry at the bases.  Few crackles.  No wheezing or rhonchi. Cardio: regular rate and rhythm, S1, S2 normal, no murmur, click, rub or gallop GI: Abdomen soft.  Mildly tender in the suprapubic area without any rebound rigidity or guarding.  No masses organomegaly appreciated. Extremities: extremities normal, atraumatic, no cyanosis or edema Pulses: 2+ and symmetric Skin: Skin color, texture, turgor normal. No rashes or lesions Lymph nodes: Cervical, supraclavicular, and axillary nodes normal. Neurologic: Disoriented.  No obvious focal neurological deficits noted.   Labs on Admission: I have personally reviewed following labs and imaging studies  CBC: Recent Labs  Lab 06/24/24 1303  WBC 12.6*  NEUTROABS 7.0  HGB 14.6  HCT 49.6*  MCV 96.7  PLT 294   Basic Metabolic Panel: Recent Labs  Lab 06/24/24 1303  NA 160*  K 3.9  CL 123*  CO2 20*  GLUCOSE 108*  BUN 76*  CREATININE 1.95*  CALCIUM 9.1   GFR: CrCl cannot be calculated (Unknown ideal weight.). Liver Function Tests: Recent Labs  Lab 06/24/24 1303  AST 15  ALT 7  ALKPHOS 61  BILITOT 1.0  PROT 6.6  ALBUMIN 2.9*    Coagulation Profile: Recent Labs  Lab 06/24/24 1303  INR 1.1   CBG: Recent Labs  Lab 06/24/24 1252  GLUCAP 112*    Radiological Exams on Admission: DG Chest Port 1 View Result Date: 06/24/2024 CLINICAL DATA:  Questionable sepsis-evaluate for abnormality. Alzheimer's disease with decreased responsiveness over the past week. EXAM: PORTABLE CHEST 1 VIEW COMPARISON:  Radiographs 10/06/2012 and 07/01/2005. Chest CT 03/04/2013. FINDINGS: 1315 hours. The heart size and mediastinal contours are stable with mild aortic atherosclerosis. New mild blunting of the left costophrenic angle with minimal patchy left basilar airspace  disease. The right lung is clear. No pneumothorax. The bones appear unremarkable. Peripherally calcified breast implants are noted bilaterally. IMPRESSION: New mild blunting of the left costophrenic angle with minimal patchy left basilar airspace disease, which could reflect atelectasis or early pneumonia. Electronically Signed   By: Elsie Perone M.D.   On: 06/24/2024 14:56    My interpretation of Electrocardiogram: Sinus rhythm 96 bpm.  Normal axis.  Nonspecific T wave changes.  No concerning ST changes.   Problem List  Principal Problem:   AKI (acute kidney injury) Active Problems:   Hypernatremia   Alzheimer dementia (HCC)   Acute encephalopathy   Assessment: This is a 83 year old Caucasian female with past medical history most significant for advanced Alzheimer's dementia who comes in with poor oral intake for the  last several weeks and is noted to be profoundly dehydrated with acute kidney injury and hypernatremia.  This is all secondary to progression of his Alzheimer's.  She is approaching end-of-life.  Plan:  #1. Acute kidney injury/hypernatremia: This is likely due to poor oral intake from her Alzheimer's.  She has been given normal saline boluses in the ED.  Will place her on D5 infusion for now.  Check metabolic panel every few hours.  Monitor urine output.  Avoid nephrotoxic agents.  Strict ins and outs for now.  #2.  Abdominal discomfort: Could have urinary retention.  In-N-Out catheterization was not successful.  EDP is ordered CT scan and they will also pursue Foley catheter placement.  UA will need to be checked to rule out infection.  Lactic acid level was noted to be normal.  #3. Advanced Alzheimer's dementia: History provided by the husband suggests progression of her Alzheimer's.  She appears to be approaching end-of-life.  She stopped eating and drinking.  #4. Concern for pneumonia: She might have aspirated.  She was hypoxic at initial assessment.  She is currently  on oxygen by nasal cannula 3 to 4 L saturating in the mid to late 90s.  Continue with antibiotics with ceftriaxone  and doxycycline  for now.  #5. Acute metabolic encephalopathy: Most likely due to dehydration in the setting of his advanced Alzheimer's dementia.  SLP evaluation.  #6. Goals of care: Discussion with patient's husband regarding goals of care.  He was told that patient's presentation is most is likely secondary to progression of her dementia.  It is very unlikely that she will improve enough to start eating and drinking again.  He wants to confer with his family including patient's daughter regarding further steps.  He is not willing to change CODE STATUS quite yet.  He is also asking about feeding her.  I have told her that in her encephalopathic state it would be risky to give her anything by mouth.  Will request palliative care to see the patient as well.  Spoke to patient's daughter over the phone.  She will be coming back to the hospital after work.  She understands the situation.  She mentions that her biological father went through similar situation earlier this year.  She would not want her mother to suffer.  She agrees with the concept of DNR and also understands that her mother was mostly at the end-of-life stage and we may need to transition her to hospice.  However she also understands her stepfather's hesitation.  She will speak with him when she comes to the hospital.  Daughter came and spoke to patient's husband. Changed to DNR.  DVT Prophylaxis: Subcutaneous heparin  Code Status: DNR Family Communication: Discussed with patient's husband Disposition: To be determined Consults called: Palliative care Admission Status: Status is: Inpatient Remains inpatient appropriate because: Acute kidney injury    Severity of Illness: The appropriate patient status for this patient is INPATIENT. Inpatient status is judged to be reasonable and necessary in order to provide the required  intensity of service to ensure the patient's safety. The patient's presenting symptoms, physical exam findings, and initial radiographic and laboratory data in the context of their chronic comorbidities is felt to place them at high risk for further clinical deterioration. Furthermore, it is not anticipated that the patient will be medically stable for discharge from the hospital within 2 midnights of admission.   * I certify that at the point of admission it is my clinical judgment that the patient will  require inpatient hospital care spanning beyond 2 midnights from the point of admission due to high intensity of service, high risk for further deterioration and high frequency of surveillance required.*   Further management decisions will depend on results of further testing and patient's response to treatment.   Nevada Kirchner  [facility] Pager on [email or web address]  06/24/2024, 3:43 PM

## 2024-06-24 NOTE — Progress Notes (Addendum)
 Notified Dr. Verdene of patient temp 97.4 and is a yellow mews and is currently currently a full code. Husband and daughter at bedside requesting  for patient to be DNR no chest compressions or intubation. Family requesting not to do rectal temp and requesting sedation for foley insertion. See MD orders for ativan  PRN for foley insertion.  Dominique Hernandez

## 2024-06-24 NOTE — ED Notes (Signed)
 Pt gurgling and shallow respirations at this time

## 2024-06-24 NOTE — ED Notes (Signed)
 Sats 95% on room air but guppy breathing and pinpoint pupils  in triage. Pt take to Trauma A

## 2024-06-25 DIAGNOSIS — N179 Acute kidney failure, unspecified: Secondary | ICD-10-CM | POA: Diagnosis not present

## 2024-06-25 DIAGNOSIS — G309 Alzheimer's disease, unspecified: Secondary | ICD-10-CM

## 2024-06-25 DIAGNOSIS — Z515 Encounter for palliative care: Secondary | ICD-10-CM | POA: Diagnosis not present

## 2024-06-25 DIAGNOSIS — Z7189 Other specified counseling: Secondary | ICD-10-CM | POA: Diagnosis not present

## 2024-06-25 DIAGNOSIS — E860 Dehydration: Secondary | ICD-10-CM | POA: Diagnosis not present

## 2024-06-25 DIAGNOSIS — F02C0 Dementia in other diseases classified elsewhere, severe, without behavioral disturbance, psychotic disturbance, mood disturbance, and anxiety: Secondary | ICD-10-CM

## 2024-06-25 LAB — CBC
HCT: 41.9 % (ref 36.0–46.0)
Hemoglobin: 12.3 g/dL (ref 12.0–15.0)
MCH: 28.7 pg (ref 26.0–34.0)
MCHC: 29.4 g/dL — ABNORMAL LOW (ref 30.0–36.0)
MCV: 97.7 fL (ref 80.0–100.0)
Platelets: 220 K/uL (ref 150–400)
RBC: 4.29 MIL/uL (ref 3.87–5.11)
RDW: 13.9 % (ref 11.5–15.5)
WBC: 9.7 K/uL (ref 4.0–10.5)
nRBC: 0 % (ref 0.0–0.2)

## 2024-06-25 LAB — BASIC METABOLIC PANEL WITH GFR
Anion gap: 9 (ref 5–15)
Anion gap: 8 (ref 5–15)
BUN: 66 mg/dL — ABNORMAL HIGH (ref 8–23)
BUN: 53 mg/dL — ABNORMAL HIGH (ref 8–23)
CO2: 25 mmol/L (ref 22–32)
CO2: 24 mmol/L (ref 22–32)
Calcium: 8.1 mg/dL — ABNORMAL LOW (ref 8.9–10.3)
Calcium: 7.8 mg/dL — ABNORMAL LOW (ref 8.9–10.3)
Chloride: 124 mmol/L — ABNORMAL HIGH (ref 98–111)
Chloride: 117 mmol/L — ABNORMAL HIGH (ref 98–111)
Creatinine, Ser: 1.15 mg/dL — ABNORMAL HIGH (ref 0.44–1.00)
Creatinine, Ser: 0.87 mg/dL (ref 0.44–1.00)
GFR, Estimated: 47 mL/min — ABNORMAL LOW (ref >60)
GFR, Estimated: >60 mL/min (ref >60)
Glucose, Bld: 138 mg/dL — ABNORMAL HIGH (ref 70–99)
Glucose, Bld: 131 mg/dL — ABNORMAL HIGH (ref 70–99)
Potassium: 3.8 mmol/L (ref 3.5–5.1)
Potassium: 3.4 mmol/L — ABNORMAL LOW (ref 3.5–5.1)
Sodium: 158 mmol/L — ABNORMAL HIGH (ref 135–145)
Sodium: 149 mmol/L — ABNORMAL HIGH (ref 135–145)

## 2024-06-25 LAB — RESP PANEL BY RT-PCR (RSV, FLU A&B, COVID)  RVPGX2
Influenza A by PCR: NEGATIVE
Influenza B by PCR: NEGATIVE
Resp Syncytial Virus by PCR: NEGATIVE
SARS Coronavirus 2 by RT PCR: NEGATIVE

## 2024-06-25 MED ORDER — CHLORHEXIDINE GLUCONATE CLOTH 2 % EX PADS
6.0000 | MEDICATED_PAD | Freq: Every day | CUTANEOUS | Status: DC
  Administered 2024-06-25 – 2024-06-28 (×4): 6 via TOPICAL

## 2024-06-25 MED ORDER — LORAZEPAM 2 MG/ML IJ SOLN
0.2500 mg | Freq: Once | INTRAMUSCULAR | Status: AC
  Administered 2024-06-25: 0.25 mg via INTRAVENOUS
  Filled 2024-06-25: qty 1

## 2024-06-25 MED ORDER — DEXTROSE 5 % IV SOLN: INTRAVENOUS | Status: DC

## 2024-06-25 MED ORDER — CLOTRIMAZOLE 1 % EX CREA
TOPICAL_CREAM | Freq: Two times a day (BID) | CUTANEOUS | Status: DC
  Filled 2024-06-25: qty 15

## 2024-06-25 NOTE — Hospital Course (Signed)
 83 y.o. F with hx Alzheimer's lives with husband, sCHF due to NICM (15% in 2015, up to 45-50% in 2022), LBBB and HLD who presented with several weeks decreased oral intake, now less responsive.  Found to have Na 160, cr 1.9, aspiration pneumonia.

## 2024-06-25 NOTE — Plan of Care (Signed)

## 2024-06-25 NOTE — Progress Notes (Signed)
 OT Cancellation Note  Patient Details Name: KIYANNA BIEGLER MRN: [id number] DOB: Nov 14, 1940   Cancelled Treatment:    Reason Eval/Treat Not Completed: Other (comment);Patient not medically ready (Pt not appropriate for OT this day and family requesting no OT services/OT sign off at this time. Please reconsult as appropriate. Thank you.)  Margarie Rockey HERO., OTR/L, MA Acute Rehab [phone]   Margarie FORBES Horns 06/25/2024, 9:18 AM

## 2024-06-25 NOTE — Consult Note (Incomplete)
 Palliative Care Consult Note                                  Date: 06/25/2024   Patient Name: Dominique Hernandez  DOB: Jan 18, 1941  MRN: [id number]  Age / Sex: 83 y.o., female  PCP: Stephanie Charlene CROME, MD Referring Physician: Jonel Lonni SHAUNNA, *  Reason for Consultation: Establishing goals of care  HPI/Patient Profile: 83 y.o. female  with past medical history of advanced Alzheimer's dementia, LBBB, and nonischemic cardiomyopathy who presented to the ED on 06/24/2024 with decreased oral intake.  She is admitted with dehydration, hypernatremia, and acute kidney injury.  Palliative Medicine has been consulted for goals of care discussions and complex medical decision making.   Clinical Assessment and Goals of Care:   Extensive chart review has been completed including labs, vital signs, imaging, progress/consult notes, orders, medications and available advance directive documents.    I met with husband/Kurt to discuss diagnosis, prognosis, GOC, EOL wishes, disposition, and options.  I introduced Palliative Medicine as specialized medical care for people living with serious illness. It focuses on providing relief from the symptoms and stress of a serious illness.   Created space and opportunity for family to express thoughts and feelings regarding current medical situation. Values and goals of care were attempted to be elicited.  Life Review: Marveen and Alverna have been married for 25 years. She has a daughter/Michelle from her previous marriage.   Functional Status: Danai lives at home with Alverna; he is her primary caregiver. He reports her functional and nutritional status has been declining over the past several months. She is non-verbal at baseline.   Discussion: We discussed patient's current illness and what it means in the larger context of her ongoing co-morbidities. We reviewed her acute issues of hypernatremia, dehydration,  AKI, and aspiration pneumonia. Alverna expresses he wants to continue current supportive care, including IV fluids, antibiotics, and labs.   We also reviewed that dementia is a progressive, non-curable disease underlying Valrie's current acute medical conditions. We reviewed specific indicators that she had reached end stages, including inability to communicate, bed-bound/non-ambulatory status, and decreased oral intake.   Alverna understands that Wendee is approaching end-of-life. He is understandable emotional and tearful. He expresses wanting to bring her home with hospice to be comfortable, after she is medically optimized to the extent possible.   Discussed the importance of continued conversation with the medical team regarding overall plan of care. Questions and concerns addressed. Emotional support provided.    Review of Systems  Unable to perform ROS   Objective:   Primary Diagnoses: Present on Admission:  AKI (acute kidney injury)  Hypernatremia  Alzheimer dementia (HCC)  Acute encephalopathy   Physical Exam Vitals reviewed.  Constitutional:      General: She is not in acute distress.    Comments: Frail and chronically ill-appearing  Pulmonary:     Effort: Pulmonary effort is normal.  Neurological:     Mental Status: She is lethargic.  Psychiatric:        Speech: She is noncommunicative.        Cognition and Memory: Cognition is impaired. Memory is impaired.     Palliative Assessment/Data: PPS 20%     Assessment & Plan:   SUMMARY OF RECOMMENDATIONS   DNR - Limited Continue current supportive interventions Goal is home with hospice when patient is medically optimized, likely in a few days PMT  will continue to follow  Primary Decision Maker: NEXT OF KIN - husband/Kurt  Existing Vynca/ACP Documentation: None  Prognosis:  < 4 weeks in the setting of minimal to no oral intake  Discharge Planning:  Home with Hospice     Thank you for allowing us  to  participate in the care of Brentlee Sciara Mccune   Time Total: 75 minutes  Detailed review of medical records (labs, imaging, vital signs), medically appropriate exam, discussed with treatment team, counseling and education to patient, family, & staff, documenting clinical information, coordination of care.   Signed by: Recardo Loll, NP Palliative Medicine Team  Team Phone # [phone]  For individual providers, please see AMION

## 2024-06-25 NOTE — Progress Notes (Signed)
 PT Cancellation Note  Patient Details Name: Dominique Hernandez MRN: [id number] DOB: 1941-04-17   Cancelled Treatment:    Reason Eval/Treat Not Completed: Per chart, plan for palliative medicine consult and potentially end of life care. Spoke with pt's husband who confirms he does not wish for PT/OT Evaluations at this time, he wants patient to be comfortable; pt currently sound asleep. Informed him that MD can reorder therapies if plan changes. Acute PT will sign off for now. Husband notes he has been providing patient maxA for all aspects of mobility/ADLs the past few weeks.  Kathaleya Mcduffee, PT, DPT Acute Rehabilitation Services  Personal: Secure Chat Rehab Office: [phone]  Darice LITTIE Almas 06/25/2024, 10:22 AM

## 2024-06-25 NOTE — Evaluation (Signed)
 Clinical/Bedside Swallow Evaluation Patient Details  Name: Dominique Hernandez MRN: [id number] Date of Birth: 14-Mar-1941  Today's Date: 06/25/2024 Time: SLP Start Time (ACUTE ONLY): [phone] SLP Stop Time (ACUTE ONLY): 1015 SLP Time Calculation (min) (ACUTE ONLY): 33 min  Past Medical History:  Past Medical History:  Diagnosis Date   LBBB (left bundle branch block)    Lung nodule    RLL   Nonischemic cardiomyopathy (HCC)    Past Surgical History:  Past Surgical History:  Procedure Laterality Date   ABDOMINAL HYSTERECTOMY     BLADDER SURGERY     BREAST SURGERY     breast implants   LEFT AND RIGHT HEART CATHETERIZATION WITH CORONARY ANGIOGRAM N/A 10/08/2012   Procedure: LEFT AND RIGHT HEART CATHETERIZATION WITH CORONARY ANGIOGRAM;  Surgeon: Debby DELENA Sor, MD;  Location: [facility] CATH LAB;  Service: Cardiovascular;  Laterality: N/A;   RIGHT & LEFT HEART CATH  10/08/2012   20% LAD,catheter induced spasm RCA,EF 15-20%   HPI:  83 y.o. female admitted 06/24/24 with worsening AMS, decreased oral intake, and weight loss the past few weeks. Workup for dehydration, hypernatremia, urinary retention, AKI. Her husband reports no intake last four days. PMH includes Alzheimer's dementia, LBBB, NICM.    Assessment / Plan / Recommendation  Clinical Impression  REC: Sips of water from straw.  May have comfort POs as able.     Pt alert; unable to f/c; not verbal at baseline with advanced dementia. Her husband at bedside.  Oral suctioning equipment set-up and oral care provided. Pt accepted drops of water from a straw and swallowed spontaneously without discomfort/cough. She was unable to sip from cup edge or draw liquid through a straw.  Mr. Gehrig conveyed that his wife was near the end of life and that he would likely take her home with comfort care.  There are no further acute care SLP needs. Our service will respectfully sign off.   SLP Visit Diagnosis: Dysphagia, unspecified (R13.10)    Aspiration  Risk    N/a   Diet Recommendation   Other (Comment) (comfort POs as able)       Other  Recommendations Oral Care Recommendations: Oral care QID       Swallow Study   General HPI: 83 y.o. female admitted 06/24/24 with worsening AMS, decreased oral intake, and weight loss the past few weeks. Workup for dehydration, hypernatremia, urinary retention, AKI. Her husband reports no intake last four days. PMH includes Alzheimer's dementia, LBBB, NICM. Type of Study: Bedside Swallow Evaluation Previous Swallow Assessment: no Diet Prior to this Study: NPO Temperature Spikes Noted: No Respiratory Status: Nasal cannula History of Recent Intubation: No Behavior/Cognition: Alert Oral Cavity Assessment: Within Functional Limits Oral Care Completed by SLP: Yes Oral Cavity - Dentition: Adequate natural dentition Self-Feeding Abilities: Total assist Patient Positioning: Upright in bed Baseline Vocal Quality: Normal Volitional Cough: Cognitively unable to elicit Volitional Swallow: Unable to elicit    Oral/Motor/Sensory Function Overall Oral Motor/Sensory Function: Other (comment) (symmetric)   Ice Chips Ice chips: Not tested   Thin Liquid Thin Liquid: Within functional limits    Nectar Thick Nectar Thick Liquid: Not tested   Honey Thick Honey Thick Liquid: Not tested   Puree Puree: Not tested   Solid     Solid: Not tested      Dominique Hernandez 06/25/2024,10:24 AM  Dominique L. Vona, MA CCC/SLP Clinical Specialist - Acute Care SLP Acute Rehabilitation Services Office number [phone]

## 2024-06-25 NOTE — Progress Notes (Signed)
  Progress Note   Patient: Dominique Hernandez FMW:[id number] DOB: 26-Dec-1940 DOA: 06/24/2024     1 DOS: the patient was seen and examined on 06/25/2024 at 8:43AM      Brief hospital course: 83 y.o. F with hx Alzheimer's lives with husband, sCHF due to NICM (15% in 2015, up to 45-50% in 2022), LBBB and HLD who presented with several weeks decreased oral intake, now less responsive.  Found to have Na 160, cr 1.9, aspiration pneumonia.     Assessment and Plan: Hypernatremia Na has come down only marginally since yestrday. - Continue hypotonic fluids - Continue serial sodium measurements   Acute kidney injury Creatinine 1.9 on admission, improved to baseline 1.1 with IV fluids overnight -Avoid nephrotoxins - Hold enalapril   Aspiration pneumonia -Continue Rocephin  and doxycycline , day 2 of 5  Dementia Acute metabolic encephalopathy At baseline, the patient is able to participate in self-cares, interact with her husband.  On presentation, she was poorly responsive, not able to stay awake long enough to eat or drink.   -Resume Aricept , memantine  - Hold quetiapine until more alert  Chronic systolic congestive heart failure Nonischemic cardiomyopathy Left bundle branch block Hyperlipidemia Blood pressure normal - Hold carvedilol , enalapril          Subjective: No acute change.  Patient remains sluggish and poorly responsive.  No new nursing concerns.  No fever overnight.  No respiratory distress.     Physical Exam: BP (!) 126/59 (BP Location: Left Arm)   Pulse 64   Temp (!) 97.5 F (36.4 C) (Oral)   Resp 19   SpO2 100%   Frail elderly female, lying in bed, opens eyes to touch, remembers incoherently, then closes her eyes again. RRR, no murmurs, patient swats me away, does not allow me to continue physical exam.  Data Reviewed: Basic metabolic panel shows resolving hypernatremia, resolving renal failure, elevated BUN CBC shows normal leukocytosis     Family  Communication: Husband at the bedside    Disposition: Status is: Inpatient         Author: Lonni SHAUNNA Dalton, MD 06/25/2024 1:58 PM  For on call review [email or web address].

## 2024-06-26 DIAGNOSIS — N179 Acute kidney failure, unspecified: Secondary | ICD-10-CM | POA: Diagnosis not present

## 2024-06-26 DIAGNOSIS — Z515 Encounter for palliative care: Secondary | ICD-10-CM | POA: Diagnosis not present

## 2024-06-26 DIAGNOSIS — E870 Hyperosmolality and hypernatremia: Secondary | ICD-10-CM | POA: Diagnosis not present

## 2024-06-26 DIAGNOSIS — E860 Dehydration: Secondary | ICD-10-CM | POA: Diagnosis not present

## 2024-06-26 LAB — CBC
HCT: 41.7 % (ref 36.0–46.0)
Hemoglobin: 12.8 g/dL (ref 12.0–15.0)
MCH: 28.7 pg (ref 26.0–34.0)
MCHC: 30.7 g/dL (ref 30.0–36.0)
MCV: 93.5 fL (ref 80.0–100.0)
Platelets: 226 K/uL (ref 150–400)
RBC: 4.46 MIL/uL (ref 3.87–5.11)
RDW: 13.2 % (ref 11.5–15.5)
WBC: 9.5 K/uL (ref 4.0–10.5)
nRBC: 0 % (ref 0.0–0.2)

## 2024-06-26 LAB — COMPREHENSIVE METABOLIC PANEL WITH GFR
ALT: 8 U/L (ref 0–44)
AST: 15 U/L (ref 15–41)
Albumin: 2.4 g/dL — ABNORMAL LOW (ref 3.5–5.0)
Alkaline Phosphatase: 50 U/L (ref 38–126)
Anion gap: 8 (ref 5–15)
BUN: 29 mg/dL — ABNORMAL HIGH (ref 8–23)
CO2: 24 mmol/L (ref 22–32)
Calcium: 8.1 mg/dL — ABNORMAL LOW (ref 8.9–10.3)
Chloride: 113 mmol/L — ABNORMAL HIGH (ref 98–111)
Creatinine, Ser: 0.75 mg/dL (ref 0.44–1.00)
GFR, Estimated: >60 mL/min (ref >60)
Glucose, Bld: 126 mg/dL — ABNORMAL HIGH (ref 70–99)
Potassium: 3 mmol/L — ABNORMAL LOW (ref 3.5–5.1)
Sodium: 145 mmol/L (ref 135–145)
Total Bilirubin: 0.7 mg/dL (ref 0.0–1.2)
Total Protein: 5.5 g/dL — ABNORMAL LOW (ref 6.5–8.1)

## 2024-06-26 MED ORDER — CARMEX CLASSIC LIP BALM EX OINT: 1.0000 | TOPICAL_OINTMENT | CUTANEOUS | Status: DC | PRN

## 2024-06-26 MED ORDER — MEMANTINE HCL 10 MG PO TABS
10.0000 mg | ORAL_TABLET | Freq: Two times a day (BID) | ORAL | Status: DC
  Administered 2024-06-27 (×2): 10 mg via ORAL
  Filled 2024-06-26 (×3): qty 1

## 2024-06-26 MED ORDER — GLYCOPYRROLATE 1 MG PO TABS
1.0000 mg | ORAL_TABLET | Freq: Three times a day (TID) | ORAL | Status: DC | PRN
  Administered 2024-06-27: 1 mg via ORAL
  Filled 2024-06-26 (×3): qty 1

## 2024-06-26 MED ORDER — CARVEDILOL 12.5 MG PO TABS
12.5000 mg | ORAL_TABLET | Freq: Two times a day (BID) | ORAL | Status: DC
  Administered 2024-06-26 – 2024-06-27 (×3): 12.5 mg via ORAL
  Filled 2024-06-26 (×5): qty 1

## 2024-06-26 MED ORDER — SALINE SPRAY 0.65 % NA SOLN: 1.0000 | NASAL | Status: DC | PRN

## 2024-06-26 MED ORDER — LORAZEPAM 2 MG/ML IJ SOLN
1.0000 mg | INTRAMUSCULAR | Status: DC | PRN
  Administered 2024-06-26: 1 mg via INTRAVENOUS
  Filled 2024-06-26: qty 1

## 2024-06-26 MED ORDER — MORPHINE SULFATE (CONCENTRATE) 10 MG /0.5 ML PO SOLN
10.0000 mg | ORAL | Status: DC | PRN
  Administered 2024-06-26 – 2024-06-28 (×2): 10 mg via ORAL
  Filled 2024-06-26 (×2): qty 0.5

## 2024-06-26 MED ORDER — POLYVINYL ALCOHOL 1.4 % OP SOLN: 1.0000 [drp] | OPHTHALMIC | Status: DC | PRN

## 2024-06-26 MED ORDER — KCL IN DEXTROSE-NACL 40-5-0.45 MEQ/L-%-% IV SOLN
INTRAVENOUS | Status: DC
  Filled 2024-06-26: qty 1000

## 2024-06-26 MED ORDER — DONEPEZIL HCL 23 MG PO TABS
23.0000 mg | ORAL_TABLET | Freq: Every day | ORAL | Status: DC
  Administered 2024-06-27: 23 mg via ORAL
  Filled 2024-06-26 (×3): qty 1

## 2024-06-26 MED ORDER — HYDROCORTISONE (PERIANAL) 2.5 % EX CREA: 1.0000 | TOPICAL_CREAM | Freq: Four times a day (QID) | CUTANEOUS | Status: DC | PRN

## 2024-06-26 NOTE — Progress Notes (Addendum)
 Palliative Medicine Progress Note   Patient Name: Dominique Hernandez       Date: 06/26/2024 DOB: 1941/06/19  Age: 83 y.o. MRN#: [id number] Attending Physician: Jonel Lonni SHAUNNA, * Primary Care Physician: Stephanie Charlene CROME, MD Admit Date: 06/24/2024   HPI/Patient Profile: 83 y.o. female  with past medical history of advanced Alzheimer's dementia, LBBB, and nonischemic cardiomyopathy who presented to the ED on 06/24/2024 with decreased oral intake.  She is admitted with dehydration, hypernatremia, and acute kidney injury.   Palliative Medicine has been consulted for goals of care discussions and complex medical decision making.  Subjective: Chart reviewed. Sodium improved to 145 today.   Bedside visit. Patient is awake; appears comfortable. Husband shares that patient was able to drinks sips of a peach milkshake this morning. He reports her congestion has improved after several rounds of suctioning overnight.  He is hopeful to bring her home with hospice in the next 1-2 days. Emotional support provided.    Objective:  Physical Exam Vitals reviewed.  Constitutional:      General: She is awake. She is not in acute distress.    Comments: Frail and chronically ill-appearing  Pulmonary:     Effort: Pulmonary effort is normal.  Psychiatric:        Speech: She is noncommunicative.        Cognition and Memory: Cognition is impaired. Memory is impaired.             Palliative Medicine Assessment & Plan   Assessment: Principal Problem:   AKI (acute kidney injury) Active Problems:   Hypernatremia   Alzheimer dementia (HCC)   Acute encephalopathy    Recommendations/Plan: DNR - Limited Continue current supportive interventions Goal is home with hospice when patient is medically  optimized, likely in 1-2 days Husband has requested [facility] - TOC aware PMT will continue to follow   Primary Decision Maker: NEXT OF KIN - husband/Dominique Hernandez  Prognosis:  < 4 weeks in the setting of minimal to no oral intake  Discharge Planning: Home with Hospice  Care plan was discussed with Dr. Jonel, RN, and LCSW  Thank you for allowing the Palliative Medicine Team to assist in the care of this patient.   Time: 25 minutes   Recardo KATHEE Loll, NP   Please contact Palliative Medicine Team phone at [phone] for  questions and concerns.  For individual providers, please see AMION.

## 2024-06-26 NOTE — Plan of Care (Signed)
  Problem: Clinical Measurements: Goal: Ability to maintain clinical measurements within normal limits will improve Outcome: Progressing Goal: Will remain free from infection Outcome: Progressing Goal: Diagnostic test results will improve Outcome: Progressing Goal: Cardiovascular complication will be avoided Outcome: Progressing   Problem: Elimination: Goal: Will not experience complications related to urinary retention Outcome: Progressing   Problem: Safety: Goal: Ability to remain free from injury will improve Outcome: Progressing

## 2024-06-26 NOTE — Plan of Care (Signed)

## 2024-06-26 NOTE — Progress Notes (Signed)
 While attending to patient ADLS this morning observed stool in the rectum area. Patient disimpacted this morning. Large amounts of type 1 stool. Unable to complete disimpact due to pain and discomfort so stopped at this time. Slight trace amount of bright red blood observed upon disimpaction. Provider made aware and informed.

## 2024-06-26 NOTE — Progress Notes (Signed)
  Progress Note   Patient: Dominique Hernandez FMW:[id number] DOB: [birth year] DOA: 06/24/2024     2 DOS: the patient was seen and examined on 06/26/2024        Brief hospital course: 83 y.o. F with hx Alzheimer's lives with husband, sCHF due to NICM (15% in 2015, up to 45-50% in 2022), LBBB and HLD who presented with several weeks decreased oral intake, now less responsive.  Found to have Na 160, cr 1.9, aspiration pneumonia.     Assessment and Plan: Hypernatremia Hypernatremia resolved - Stop fluids     Acute kidney injury Creatinine 1.9 on admission, improved to 0.7 with fluids - Hold enalapril  - Avoid nephrotoxin   Aspiration pneumonia - Continue Rocephin  and doxycycline , day 3 of 5   Dementia Acute metabolic encephalopathy See prior summary No improvement with treatment of pneumonia, resolution of AKI and hypernatremia. - Continue Aricept  and memantine  -Liberalize diet   Chronic systolic congestive heart failure Nonischemic cardiomyopathy Left bundle branch block Hyperlipidemia Blood pressure slightly elevated -Resume carvedilol  - Hold enalapril    Hypokalemia At present, I feel she cannot tolerate PO potassium, and discomfort from IV potassium outweighs benefit of supplementation. - Liberalize diet            Subjective: No clinical change.  Have a lot of oral secretions overnight and seemed to be choking on them.  No nursing concerns, no fever, no respiratory symptoms.     Physical Exam: BP (!) 149/54 (BP Location: Left Leg)   Pulse 74   Temp 97.7 F (36.5 C) (Oral)   Resp 19   SpO2 100%   Frail elderly female, lying in bed, sleeping, sluggish and does not rouse well RRR, no murmurs, no peripheral edema Respiratory rate normal, lung sounds diminished, no rales or wheezing Decreased attention, decreased mentation, severe generalized weakness    Data Reviewed: Basic metabolic panel shows resolution of hypernatremia, new hypokalemia Basic  metabolic panel shows resolution of leukocytosis     Family Communication: Husband at the bedside    Disposition: Status is: Inpatient         Author: Lonni SHAUNNA Dalton, MD 06/26/2024 8:27 AM  For on call review [email or web address].

## 2024-06-27 DIAGNOSIS — N179 Acute kidney failure, unspecified: Secondary | ICD-10-CM | POA: Diagnosis not present

## 2024-06-27 MED ORDER — ENSURE PLUS HIGH PROTEIN PO LIQD
237.0000 mL | Freq: Two times a day (BID) | ORAL | Status: DC
  Administered 2024-06-27 – 2024-06-28 (×3): 237 mL via ORAL

## 2024-06-27 MED ORDER — LORAZEPAM 2 MG/ML IJ SOLN: 1.0000 mg | Freq: Four times a day (QID) | INTRAMUSCULAR | Status: DC | PRN

## 2024-06-27 NOTE — Progress Notes (Signed)
 Doctor aware, patient has no IV. IV antibiotics discontinued

## 2024-06-27 NOTE — Progress Notes (Signed)
 Patient placed back on 2 L [place] from being on room air, no distress noted, but O2 was at 85%. Patient will be on oxygen at home. Discussed with family.

## 2024-06-27 NOTE — Progress Notes (Signed)
 Patient has no IV, IV antibiotics are ordered. Doctor notified to see if she needs a new IV.

## 2024-06-27 NOTE — Plan of Care (Signed)

## 2024-06-27 NOTE — TOC Initial Note (Signed)
 Transition of Care [facility]) - Initial/Assessment Note    Patient Details  Name: Dominique Hernandez MRN: [id number] Date of Birth: 07/06/1941  [facility] CM/SW Contact:    Andrez JULIANNA George, RN Phone Number: 06/27/2024, 11:27 AM  Clinical Narrative:                  Pt is from home with her spouse. Spouse has been providing care at home. Pt has been made comfort care and will discharge home with hospice services.  CM met with the patient and her daughter and they asked to use [facility] for home hospice. Referral sent. Daughter is the contact person and [facility] aware.  DME needed for home: bed, oxygen, suction, table, pads--[facility] updated  Pt will transport home via PTAR. Address: [address] in [place]  Plan is for d/c home tomorrow once the DME is in place.   IP Care management following.  Expected Discharge Plan: Home w Hospice Care Barriers to Discharge: Equipment Delay   Patient Goals and CMS Choice   CMS Medicare.gov Compare Post Acute Care list provided to:: Patient Represenative (must comment) Choice offered to / list presented to : Adult Children      Expected Discharge Plan and Services   Discharge Planning Services: CM Consult Post Acute Care Choice: Hospice Living arrangements for the past 2 months: Single Family Home                                      Prior Living Arrangements/Services Living arrangements for the past 2 months: Single Family Home Lives with:: Spouse Patient language and need for interpreter reviewed:: Yes        Need for Family Participation in Patient Care: Yes (Comment) Care giver support system in place?: Yes (comment)   Criminal Activity/Legal Involvement Pertinent to Current Situation/Hospitalization: No - Comment as needed  Activities of Daily Living      Permission Sought/Granted                  Emotional Assessment Appearance:: Appears stated age         Psych  Involvement: No (comment)  Admission diagnosis:  Acute hypernatremia [E87.0] Severe dehydration [E86.0] AKI (acute kidney injury) [N17.9] Severe sepsis (HCC) [A41.9, R65.20] Community acquired pneumonia of left lung, unspecified part of lung [J18.9] Patient Active Problem List   Diagnosis Date Noted   AKI (acute kidney injury) 06/24/2024   Hypernatremia 06/24/2024   Alzheimer dementia (HCC) 06/24/2024   Acute encephalopathy 06/24/2024   Alzheimer disease (HCC) 09/21/2018   Essential hypertension 06/29/2016   Hypercholesterolemia 10/03/2013   Chronic combined systolic (congestive) and diastolic (congestive) heart failure (HCC) 01/16/2013   Nonischemic cardiomyopathy- 10/09/2012   Normal coronary arteries 10/08/12 10/09/2012   Lung nodule, seen on CT 10/07/12- needs follow up in 6 mos 10/06/2012   Acute systolic congestive heart failure (HCC) 10/06/2012   Leukocytosis 10/06/2012   LBBB (left bundle branch block) 10/06/2012   PCP:  Stephanie Charlene CROME, MD Pharmacy:   [facility] [phone] [address] [place], [place] - [address] [facility] [address] [place] [address] Phone: [phone] Fax: [phone]     Social Drivers of Health (SDOH) Social History: SDOH Screenings   Food Insecurity: Patient Unable To Answer (06/26/2024)  Housing: Patient Unable To Answer (06/26/2024)  Transportation Needs: Patient Unable To Answer (06/26/2024)  Utilities: Patient Unable To  Answer (06/26/2024)  Social Connections: Patient Unable To Answer (06/26/2024)  Tobacco Use: Medium Risk (05/15/2024)   SDOH Interventions:     Readmission Risk Interventions     No data to display

## 2024-06-27 NOTE — Progress Notes (Signed)
 [facility] [phone] [facility] Liaison Note  Received request from Andrez George, [facility], for hospice services at home after discharge. Spoke with daughter, Rosaline Sharps, to initiate education related to hospice philosophy, services, and team approach to care.  Per discussion, the plan is for discharge home Saturday 11.29 by PTAR.  DME requested and ordered, hospital bed with 1/2 rails, OBT, O2 @ 2lpm and suction. Delivery is to [address], [place]. Rosaline is there family contact to arrange time of delivery.  Please send completed and signed DNR with patient.   Please provide prescriptions at discharge as needed to ensure ongoing symptom management.  Please call with any questions or concerns.  Thank you, Randine Nail, BSN, [facility] [phone]

## 2024-06-27 NOTE — Progress Notes (Signed)
  Progress Note   Patient: Dominique Hernandez FMW:[id number] DOB: 1940/11/05 DOA: 06/24/2024     3 DOS: the patient was seen and examined on 06/27/2024        Brief hospital course: 83 y.o. F with hx Alzheimer's lives with husband, sCHF due to NICM (15% in 2015, up to 45-50% in 2022), LBBB and HLD who presented with several weeks decreased oral intake, now less responsive.  Found to have Na 160, cr 1.9, aspiration pneumonia.     Assessment and Plan: Hypernatremia AKI Aspiration pneumonia Dementia Acute metabolic encephalopathy Chronic systolic congestive heart failure Nonischemic cardiomyopathy Left bundle branch block Hyperlipidemia Hypokalemia The patient was admitted and started on hypotonic fluids and antibiotics.  Although at baseline she is able to respond to questions, interact with husband, participate in self-cares, here she has been poorly responsive, unable to swallow safely, not staying awake long enough to eat or drink.  Despite correction of hypernatremia and treatment with antibiotics and correction of renal failure, she is continued to have poor alertness, excessive somnolence, encephalopathy.  In light of that, family have decided to transition to hospice, at home, and hospice referral has been sought. - Stop antibiotics - Stop fluids given concern for excessive secretions. - I do not believe she would tolerate potassium supplementation - Full comfort cares - Referral to hospice  -Okay to continue carvedilol , donepezil , memantine  if tolerated        Subjective: No new fever, no respiratory distress, no vomiting.  She is not eating much, she is not very alert     Physical Exam: BP 133/66 (BP Location: Left Leg)   Pulse 82   Temp 98.2 F (36.8 C) (Oral)   Resp 16   Wt 48.3 kg   SpO2 99%   BMI 20.80 kg/m   Frail elderly female, lying in bed, sleeping, sluggish and does not rouse well    Data Reviewed: No new labs   Family Communication:  Daughter at the bedside    Disposition: Status is: Inpatient         Author: Lonni SHAUNNA Dalton, MD 06/27/2024 9:39 AM  For on call review [email or web address].

## 2024-06-28 ENCOUNTER — Other Ambulatory Visit (HOSPITAL_COMMUNITY): Payer: Self-pay

## 2024-06-28 DIAGNOSIS — N179 Acute kidney failure, unspecified: Secondary | ICD-10-CM | POA: Diagnosis not present

## 2024-06-28 MED ORDER — GLYCOPYRROLATE 1 MG PO TABS
1.0000 mg | ORAL_TABLET | Freq: Three times a day (TID) | ORAL | 0 refills | Status: DC | PRN
  Filled 2024-06-28: qty 30, 10d supply, fill #0

## 2024-06-28 MED ORDER — MORPHINE SULFATE (CONCENTRATE) 10 MG /0.5 ML PO SOLN
10.0000 mg | ORAL | 0 refills | Status: DC | PRN
  Filled 2024-06-28: qty 118, 20d supply, fill #0

## 2024-06-28 MED ORDER — ENSURE PLUS HIGH PROTEIN PO LIQD: 237.0000 mL | Freq: Two times a day (BID) | ORAL | Status: DC

## 2024-06-28 NOTE — Progress Notes (Signed)
 Patient off the floor with PTAR, no distress noted, left on oxygen. Paperwork and DNR given to ambulance service

## 2024-06-28 NOTE — Plan of Care (Signed)
 Problem: Education: Goal: Knowledge of General Education information will improve Description: Including pain rating scale, medication(s)/side effects and non-pharmacologic comfort measures 06/28/2024 1053 by Burnard Almarie BROCKS, RN Outcome: Adequate for Discharge 06/28/2024 1052 by Burnard Almarie BROCKS, RN Outcome: Progressing 06/28/2024 0854 by Burnard Almarie BROCKS, RN Outcome: Progressing   Problem: Health Behavior/Discharge Planning: Goal: Ability to manage health-related needs will improve 06/28/2024 1053 by Burnard Almarie BROCKS, RN Outcome: Adequate for Discharge 06/28/2024 1052 by Burnard Almarie BROCKS, RN Outcome: Progressing 06/28/2024 0854 by Burnard Almarie BROCKS, RN Outcome: Progressing   Problem: Clinical Measurements: Goal: Ability to maintain clinical measurements within normal limits will improve 06/28/2024 1053 by Burnard Almarie BROCKS, RN Outcome: Adequate for Discharge 06/28/2024 1052 by Burnard Almarie BROCKS, RN Outcome: Progressing 06/28/2024 0854 by Burnard Almarie BROCKS, RN Outcome: Progressing Goal: Will remain free from infection 06/28/2024 1053 by Burnard Almarie BROCKS, RN Outcome: Adequate for Discharge 06/28/2024 1052 by Burnard Almarie BROCKS, RN Outcome: Progressing 06/28/2024 0854 by Burnard Almarie BROCKS, RN Outcome: Progressing Goal: Diagnostic test results will improve 06/28/2024 1053 by Burnard Almarie BROCKS, RN Outcome: Adequate for Discharge 06/28/2024 1052 by Burnard Almarie BROCKS, RN Outcome: Progressing 06/28/2024 0854 by Burnard Almarie BROCKS, RN Outcome: Progressing Goal: Respiratory complications will improve 06/28/2024 1053 by Burnard Almarie BROCKS, RN Outcome: Adequate for Discharge 06/28/2024 1052 by Burnard Almarie BROCKS, RN Outcome: Progressing 06/28/2024 0854 by Burnard Almarie BROCKS, RN Outcome: Progressing Goal: Cardiovascular complication will be avoided 06/28/2024 1053 by Burnard Almarie BROCKS, RN Outcome: Adequate for Discharge 06/28/2024 1052 by Burnard Almarie BROCKS, RN Outcome: Progressing 06/28/2024 0854 by Burnard Almarie BROCKS, RN Outcome: Progressing   Problem: Activity: Goal: Risk for activity intolerance will decrease 06/28/2024 1053 by Burnard Almarie BROCKS, RN Outcome: Adequate for Discharge 06/28/2024 1052 by Burnard Almarie BROCKS, RN Outcome: Progressing 06/28/2024 0854 by Burnard Almarie BROCKS, RN Outcome: Progressing   Problem: Nutrition: Goal: Adequate nutrition will be maintained 06/28/2024 1053 by Burnard Almarie BROCKS, RN Outcome: Adequate for Discharge 06/28/2024 1052 by Burnard Almarie BROCKS, RN Outcome: Progressing 06/28/2024 0854 by Burnard Almarie BROCKS, RN Outcome: Progressing   Problem: Coping: Goal: Level of anxiety will decrease 06/28/2024 1053 by Burnard Almarie BROCKS, RN Outcome: Adequate for Discharge 06/28/2024 1052 by Burnard Almarie BROCKS, RN Outcome: Progressing 06/28/2024 0854 by Burnard Almarie BROCKS, RN Outcome: Progressing   Problem: Elimination: Goal: Will not experience complications related to bowel motility 06/28/2024 1053 by Burnard Almarie BROCKS, RN Outcome: Adequate for Discharge 06/28/2024 1052 by Burnard Almarie BROCKS, RN Outcome: Progressing 06/28/2024 0854 by Burnard Almarie BROCKS, RN Outcome: Progressing Goal: Will not experience complications related to urinary retention 06/28/2024 1053 by Burnard Almarie BROCKS, RN Outcome: Adequate for Discharge 06/28/2024 1052 by Burnard Almarie BROCKS, RN Outcome: Progressing 06/28/2024 0854 by Burnard Almarie BROCKS, RN Outcome: Progressing   Problem: Pain Managment: Goal: General experience of comfort will improve and/or be controlled 06/28/2024 1053 by Burnard Almarie BROCKS, RN Outcome: Adequate for Discharge 06/28/2024 1052 by Burnard Almarie BROCKS, RN Outcome: Progressing 06/28/2024 0854 by Burnard Almarie BROCKS, RN Outcome: Progressing   Problem: Safety: Goal: Ability to remain free from injury will improve 06/28/2024 1053 by Burnard Almarie BROCKS, RN Outcome: Adequate for  Discharge 06/28/2024 1052 by Burnard Almarie BROCKS, RN Outcome: Progressing 06/28/2024 0854 by Burnard Almarie BROCKS, RN Outcome: Progressing   Problem: Skin Integrity: Goal: Risk for impaired skin integrity will decrease 06/28/2024 1053 by Burnard Almarie BROCKS, RN Outcome: Adequate for Discharge 06/28/2024 1052 by Burnard Almarie BROCKS, RN Outcome: Progressing 06/28/2024 0854 by Burnard,  Almarie BROCKS, RN Outcome: Progressing

## 2024-06-28 NOTE — Progress Notes (Signed)
 Patient is discharged, education given to her husband. Belongings with patient. Medications picked up by husband from pharmacy. Instructions given on when to give. Patient ot go home on hospice. Oxygen and bed already delivered to their home. Foley left in. Patient husband instructed to discuss with hospice nurse any questions concerning it being changed out/replaced.

## 2024-06-28 NOTE — TOC Transition Note (Signed)
 Transition of Care [facility]) - Discharge Note   Patient Details  Name: Dominique Hernandez MRN: [id number] Date of Birth: 06/05/1941  [facility] CM/SW Contact:  Marval Gell, RN Phone Number: 06/28/2024, 10:30 AM   Clinical Narrative:     Spoke w patient's daughter and confirmed that DME has been delivered. Discussed transport time, and [facility] is called for 12:00 pick up, bedside RN aware. PT[facility] forms placed on chart.      Barriers to Discharge: Equipment Delay   Patient Goals and CMS Choice   CMS Medicare.gov Compare Post Acute Care list provided to:: Patient Represenative (must comment) Choice offered to / list presented to : Adult Children      Discharge Placement                       Discharge Plan and Services Additional resources added to the After Visit Summary for     Discharge Planning Services: CM Consult Post Acute Care Choice: Hospice                               Social Drivers of Health (SDOH) Interventions SDOH Screenings   Food Insecurity: Patient Unable To Answer (06/26/2024)  Housing: Patient Unable To Answer (06/26/2024)  Transportation Needs: Patient Unable To Answer (06/26/2024)  Utilities: Patient Unable To Answer (06/26/2024)  Social Connections: Patient Unable To Answer (06/26/2024)  Tobacco Use: Medium Risk (05/15/2024)     Readmission Risk Interventions     No data to display

## 2024-06-28 NOTE — Plan of Care (Signed)

## 2024-06-28 NOTE — Plan of Care (Signed)
  Problem: Education: Goal: Knowledge of General Education information will improve Description: Including pain rating scale, medication(s)/side effects and non-pharmacologic comfort measures 06/28/2024 1052 by Burnard Almarie BROCKS, RN Outcome: Progressing 06/28/2024 0854 by Burnard Almarie BROCKS, RN Outcome: Progressing   Problem: Health Behavior/Discharge Planning: Goal: Ability to manage health-related needs will improve 06/28/2024 1052 by Burnard Almarie BROCKS, RN Outcome: Progressing 06/28/2024 0854 by Burnard Almarie BROCKS, RN Outcome: Progressing   Problem: Clinical Measurements: Goal: Ability to maintain clinical measurements within normal limits will improve 06/28/2024 1052 by Burnard Almarie BROCKS, RN Outcome: Progressing 06/28/2024 0854 by Burnard Almarie BROCKS, RN Outcome: Progressing Goal: Will remain free from infection 06/28/2024 1052 by Burnard Almarie BROCKS, RN Outcome: Progressing 06/28/2024 0854 by Burnard Almarie BROCKS, RN Outcome: Progressing Goal: Diagnostic test results will improve 06/28/2024 1052 by Burnard Almarie BROCKS, RN Outcome: Progressing 06/28/2024 0854 by Burnard Almarie BROCKS, RN Outcome: Progressing Goal: Respiratory complications will improve 06/28/2024 1052 by Burnard Almarie BROCKS, RN Outcome: Progressing 06/28/2024 0854 by Burnard Almarie BROCKS, RN Outcome: Progressing Goal: Cardiovascular complication will be avoided 06/28/2024 1052 by Burnard Almarie BROCKS, RN Outcome: Progressing 06/28/2024 0854 by Burnard Almarie BROCKS, RN Outcome: Progressing   Problem: Activity: Goal: Risk for activity intolerance will decrease 06/28/2024 1052 by Burnard Almarie BROCKS, RN Outcome: Progressing 06/28/2024 0854 by Burnard Almarie BROCKS, RN Outcome: Progressing   Problem: Nutrition: Goal: Adequate nutrition will be maintained 06/28/2024 1052 by Burnard Almarie BROCKS, RN Outcome: Progressing 06/28/2024 0854 by Burnard Almarie BROCKS, RN Outcome: Progressing   Problem: Coping: Goal: Level  of anxiety will decrease 06/28/2024 1052 by Burnard Almarie BROCKS, RN Outcome: Progressing 06/28/2024 0854 by Burnard Almarie BROCKS, RN Outcome: Progressing   Problem: Elimination: Goal: Will not experience complications related to bowel motility 06/28/2024 1052 by Burnard Almarie BROCKS, RN Outcome: Progressing 06/28/2024 0854 by Burnard Almarie BROCKS, RN Outcome: Progressing Goal: Will not experience complications related to urinary retention 06/28/2024 1052 by Burnard Almarie BROCKS, RN Outcome: Progressing 06/28/2024 0854 by Burnard Almarie BROCKS, RN Outcome: Progressing   Problem: Pain Managment: Goal: General experience of comfort will improve and/or be controlled 06/28/2024 1052 by Burnard Almarie BROCKS, RN Outcome: Progressing 06/28/2024 0854 by Burnard Almarie BROCKS, RN Outcome: Progressing   Problem: Safety: Goal: Ability to remain free from injury will improve 06/28/2024 1052 by Burnard Almarie BROCKS, RN Outcome: Progressing 06/28/2024 0854 by Burnard Almarie BROCKS, RN Outcome: Progressing   Problem: Skin Integrity: Goal: Risk for impaired skin integrity will decrease 06/28/2024 1052 by Burnard Almarie BROCKS, RN Outcome: Progressing 06/28/2024 0854 by Burnard Almarie BROCKS, RN Outcome: Progressing

## 2024-06-28 NOTE — Discharge Summary (Signed)
 Physician Discharge Summary   Patient: Dominique Hernandez MRN: [id number] DOB: [birth year]  Admit date:     06/24/2024  Discharge date: 06/28/24  Discharge Physician: Lonni SHAUNNA Dalton   PCP: Stephanie Charlene CROME, MD     Recommendations at discharge:  Discharged with [facility]     Discharge Diagnoses: Principal Problem: Acute renal failure Active Problems:   Hypernatremia   Aspiration pneumonia   Alzheimer's dementia   Acute metabolic encephalopathy   Chronic systolic congestive heart failure   Nonischemic cardiomyopathy   Left bundle branch block   Hyperlipidemia       Hypokalemia    Hospital Course: 83 y.o. F with hx Alzheimer's lives with husband, sCHF due to NICM (15% in 2015, up to 45-50% in 2022), LBBB and HLD who presented with several weeks decreased oral intake, now less responsive.  Found to have Na 160, cr 1.9, aspiration pneumonia.  The patient was admitted and started on hypotonic fluids and antibiotics.     Despite correction of hypernatremia and treatment with antibiotics and correction of renal failure, she remained poorly responsive, unable to swallow safely, not staying awake long enough to eat or drink.   Family discussed goals of care with medical team and Palliative care and based on her known disease trajectory, family support and patient wishes, we have transitioned to home hospice.  Symptoms were managed with oral agents, and [facility] hospice have been engaged for treatment.          The [place]  Controlled Substances Registry was reviewed for this patient prior to discharge.  Consultants: Palliative Care   Disposition: Hospice care Diet recommendation: For comfort   DISCHARGE MEDICATION: Allergies as of 06/28/2024       Reactions   Aspirin  Other (See Comments)   Causes redness on skin        Medication List     STOP taking these medications    cephALEXin 250 MG/5ML suspension Commonly known as:  KEFLEX   donepezil  23 MG Tabs tablet Commonly known as: ARICEPT    enalapril  20 MG tablet Commonly known as: VASOTEC    memantine  10 MG tablet Commonly known as: NAMENDA    QUEtiapine 50 MG tablet Commonly known as: SEROQUEL       TAKE these medications    carvedilol  12.5 MG tablet Commonly known as: COREG  Take 1 tablet (12.5 mg total) by mouth 2 (two) times daily.   feeding supplement Liqd Take 237 mLs by mouth 2 (two) times daily between meals.   glycopyrrolate  1 MG tablet Commonly known as: ROBINUL  Take 1 tablet (1 mg total) by mouth 3 (three) times daily as needed (Secretions).   morphine  CONCENTRATE 10 mg / 0.5 ml concentrated solution Take 0.5 mLs (10 mg total) by mouth every 2 (two) hours as needed for severe pain (pain score 7-10), moderate pain (pain score 4-6) or shortness of breath (or sleep).        Follow-up Information     [facility] Follow up.   Specialty: Hospice and Palliative Medicine Contact information: [address] [place]  [address] [phone]                Discharge Instructions     Discharge instructions   Complete by: As directed    **IMPORTANT DISCHARGE INSTRUCTIONS**   From Dr. Dalton:    The medicine for pain, shortness of breath, or sleep is morphine  concentrate  Give 0.5mL up to every 4 hours You may increase this to 1mL  if needed Each dose takes about 45 minutes to an hour to take effect, so wait to see how the first dose reacts before administering extra doses    You *may* give carvedilol /Coreg  the heart medicine if desired, but if she doesn't want to take it, do not make her uncomfortable by forcing it  If you need to give medicine for too much oral secretions, use Robinul        Discharge Exam: Filed Weights   06/26/24 0800 06/26/24 1351  Weight: 49.1 kg 48.3 kg    General: Patient is somnolent, no acute distress  Cardiovascular: No peripheral edema.   Respiratory: Normal  respiratory rate and rhythm.       Condition at discharge: worsening  The results of significant diagnostics from this hospitalization (including imaging, microbiology, ancillary and laboratory) are listed below for reference.   Imaging Studies: CT ABDOMEN PELVIS WO CONTRAST Result Date: 06/24/2024 EXAM: CT ABDOMEN AND PELVIS WITHOUT CONTRAST 06/24/2024 04:48:34 PM TECHNIQUE: CT of the abdomen and pelvis was performed without the administration of intravenous contrast. Multiplanar reformatted images are provided for review. Automated exposure control, iterative reconstruction, and/or weight-based adjustment of the mA/kV was utilized to reduce the radiation dose to as low as reasonably achievable. COMPARISON: None available. CLINICAL HISTORY: Abdominal pain, acute, nonlocalized; generalized abd pain, LLQ, RUQ worst, new renal failure. FINDINGS: LOWER CHEST: Trace pericardial effusion. Bilateral rib calcified breast implants partially visualized. LIVER: The liver is unremarkable. GALLBLADDER AND BILE DUCTS: Gallbladder is unremarkable. No biliary ductal dilatation. SPLEEN: No acute abnormality. PANCREAS: No acute abnormality. ADRENAL GLANDS: No acute abnormality. KIDNEYS, URETERS AND BLADDER: No stones in the kidneys or ureters. No hydronephrosis. No perinephric or periureteral stranding. Urinary bladder is unremarkable. GI AND BOWEL: Severe sigmoid diverticulosis. The stomach, small bowel, and large bowel are otherwise unremarkable. Appendix absent. PERITONEUM AND RETROPERITONEUM: No ascites. No free air. VASCULATURE: Moderate aortoiliac atherosclerotic calcification. No aortic aneurysm. LYMPH NODES: No lymphadenopathy. REPRODUCTIVE ORGANS: Uterus absent. No adnexal masses. BONES AND SOFT TISSUES: Removal of L1 compression deformity, stable since prior radiograph of 02/24/2021, with mild retropulsion of the posterior superior vertebral body by 4 - 5 mm. Remote superior endplate fracture U87. No acute bone  abnormality. No lytic or blastic bone lesion. No focal soft tissue abnormality. IMPRESSION: 1. Severe sigmoid diverticulosis without evidence of diverticulitis. Electronically signed by: Dorethia Molt MD 06/24/2024 05:07 PM EST RP Workstation: [id number]   DG Chest Port 1 View Result Date: 06/24/2024 CLINICAL DATA:  Questionable sepsis-evaluate for abnormality. Alzheimer's disease with decreased responsiveness over the past week. EXAM: PORTABLE CHEST 1 VIEW COMPARISON:  Radiographs 10/06/2012 and 07/01/2005. Chest CT 03/04/2013. FINDINGS: 1315 hours. The heart size and mediastinal contours are stable with mild aortic atherosclerosis. New mild blunting of the left costophrenic angle with minimal patchy left basilar airspace disease. The right lung is clear. No pneumothorax. The bones appear unremarkable. Peripherally calcified breast implants are noted bilaterally. IMPRESSION: New mild blunting of the left costophrenic angle with minimal patchy left basilar airspace disease, which could reflect atelectasis or early pneumonia. Electronically Signed   By: Elsie Perone M.D.   On: 06/24/2024 14:56    Microbiology: Results for orders placed or performed during the hospital encounter of 06/24/24  Blood Culture (routine x 2)     Status: None (Preliminary result)   Collection Time: 06/24/24  1:08 PM   Specimen: BLOOD  Result Value Ref Range Status   Specimen Description BLOOD SITE NOT SPECIFIED  Final   Special Requests  Final    BOTTLES DRAWN AEROBIC AND ANAEROBIC Blood Culture results may not be optimal due to an inadequate volume of blood received in culture bottles   Culture   Final    NO GROWTH 4 DAYS Performed at [facility], [address] [place], [place] [address]    Report Status PENDING  Incomplete  Blood Culture (routine x 2)     Status: None (Preliminary result)   Collection Time: 06/24/24  3:25 PM   Specimen: BLOOD  Result Value Ref Range Status   Specimen Description BLOOD  LEFT ANTECUBITAL  Final   Special Requests   Final    BOTTLES DRAWN AEROBIC AND ANAEROBIC Blood Culture results may not be optimal due to an inadequate volume of blood received in culture bottles   Culture   Final    NO GROWTH 4 DAYS Performed at [facility], [address], [place], [place] [address]    Report Status PENDING  Incomplete  Resp panel by RT-PCR (RSV, Flu A&B, Covid) Anterior Nasal Swab     Status: None   Collection Time: 06/24/24  6:41 PM   Specimen: Anterior Nasal Swab  Result Value Ref Range Status   SARS Coronavirus 2 by RT PCR NEGATIVE NEGATIVE Final   Influenza A by PCR NEGATIVE NEGATIVE Final   Influenza B by PCR NEGATIVE NEGATIVE Final    Comment: (NOTE) The Xpert Xpress SARS-CoV-2/FLU/RSV plus assay is intended as an aid in the diagnosis of influenza from Nasopharyngeal swab specimens and should not be used as a sole basis for treatment. Nasal washings and aspirates are unacceptable for Xpert Xpress SARS-CoV-2/FLU/RSV testing.  Fact Sheet for Patients: bloggercourse.com  Fact Sheet for Healthcare Providers: seriousbroker.it  This test is not yet approved or cleared by the [place]  FDA and has been authorized for detection and/or diagnosis of SARS-CoV-2 by FDA under an Emergency Use Authorization (EUA). This EUA will remain in effect (meaning this test can be used) for the duration of the COVID-19 declaration under Section 564(b)(1) of the Act, 21 U.S.C. section 360bbb-3(b)(1), unless the authorization is terminated or revoked.     Resp Syncytial Virus by PCR NEGATIVE NEGATIVE Final    Comment: (NOTE) Fact Sheet for Patients: bloggercourse.com  Fact Sheet for Healthcare Providers: seriousbroker.it  This test is not yet approved or cleared by the [place]  FDA and has been authorized for detection and/or diagnosis of SARS-CoV-2 by FDA  under an Emergency Use Authorization (EUA). This EUA will remain in effect (meaning this test can be used) for the duration of the COVID-19 declaration under Section 564(b)(1) of the Act, 21 U.S.C. section 360bbb-3(b)(1), unless the authorization is terminated or revoked.  Performed at [facility], [address] [place], [place] [address]     Labs: CBC: Recent Labs  Lab 06/24/24 1303 06/25/24 0351 06/26/24 0404  WBC 12.6* 9.7 9.5  NEUTROABS 7.0  --   --   HGB 14.6 12.3 12.8  HCT 49.6* 41.9 41.7  MCV 96.7 97.7 93.5  PLT 294 220 226   Basic Metabolic Panel: Recent Labs  Lab 06/24/24 1303 06/24/24 2128 06/25/24 0351 06/25/24 1338 06/26/24 0404  NA 160* 159* 158* 149* 145  K 3.9 4.0 3.8 3.4* 3.0*  CL 123* 126* 124* 117* 113*  CO2 20* 24 25 24 24   GLUCOSE 108* 93 138* 131* 126*  BUN 76* 67* 66* 53* 29*  CREATININE 1.95* 1.24* 1.15* 0.87 0.75  CALCIUM 9.1 7.9* 8.1*  7.8* 8.1*   Liver Function Tests: Recent Labs  Lab 06/24/24 1303 06/26/24 0404  AST 15 15  ALT 7 8  ALKPHOS 61 50  BILITOT 1.0 0.7  PROT 6.6 5.5*  ALBUMIN 2.9* 2.4*   CBG: Recent Labs  Lab 06/24/24 1252  GLUCAP 112*    Discharge time spent: approximately 45 minutes spent on discharge counseling, evaluation of patient on day of discharge, and coordination of discharge planning with nursing, social work, pharmacy and case management  Signed: Lonni SHAUNNA Dalton, MD [facility] 06/28/2024

## 2024-06-29 LAB — CULTURE, BLOOD (ROUTINE X 2)
Culture: NO GROWTH
Culture: NO GROWTH

## 2024-07-04 ENCOUNTER — Other Ambulatory Visit (HOSPITAL_COMMUNITY): Payer: Self-pay

## 2024-07-07 ENCOUNTER — Other Ambulatory Visit (HOSPITAL_COMMUNITY): Payer: Self-pay

## 2024-07-29 ENCOUNTER — Other Ambulatory Visit (HOSPITAL_COMMUNITY): Payer: Self-pay

## 2024-07-31 DEATH — deceased
